# Patient Record
Sex: Male | Born: 1960
Health system: Southern US, Community
[De-identification: ages and names within clinical notes are randomized; demographics above are authoritative.]

## PROBLEM LIST (undated history)

## (undated) DIAGNOSIS — I10 Essential (primary) hypertension: Secondary | ICD-10-CM

## (undated) DIAGNOSIS — E119 Type 2 diabetes mellitus without complications: Secondary | ICD-10-CM

## (undated) DIAGNOSIS — M1990 Unspecified osteoarthritis, unspecified site: Secondary | ICD-10-CM

## (undated) DIAGNOSIS — E785 Hyperlipidemia, unspecified: Secondary | ICD-10-CM

## (undated) HISTORY — PX: CATARACT EXTRACTION: SUR2

## (undated) HISTORY — DX: Essential (primary) hypertension: I10

## (undated) HISTORY — DX: Hyperlipidemia, unspecified: E78.5

---

## 2005-10-21 ENCOUNTER — Ambulatory Visit: Payer: Self-pay | Admitting: Gastroenterology

## 2008-05-15 ENCOUNTER — Inpatient Hospital Stay: Payer: Self-pay | Admitting: Specialist

## 2013-03-01 ENCOUNTER — Emergency Department: Payer: Self-pay | Admitting: Emergency Medicine

## 2013-03-01 LAB — BASIC METABOLIC PANEL
Anion Gap: 8 (ref 7–16)
BUN: 13 mg/dL (ref 7–18)
Chloride: 106 mmol/L (ref 98–107)
Creatinine: 0.91 mg/dL (ref 0.60–1.30)
EGFR (African American): >60
EGFR (Non-African Amer.): >60
Glucose: 136 mg/dL — ABNORMAL HIGH (ref 65–99)
Potassium: 4.2 mmol/L (ref 3.5–5.1)
Sodium: 139 mmol/L (ref 136–145)

## 2013-03-01 LAB — CBC
HCT: 44.2 % (ref 40.0–52.0)
HGB: 15.3 g/dL (ref 13.0–18.0)
MCH: 32 pg (ref 26.0–34.0)
MCHC: 34.6 g/dL (ref 32.0–36.0)
RBC: 4.79 10*6/uL (ref 4.40–5.90)
RDW: 13.1 % (ref 11.5–14.5)

## 2013-03-01 LAB — TROPONIN I: Troponin-I: <0.02 ng/mL

## 2013-03-05 ENCOUNTER — Encounter: Payer: Self-pay | Admitting: Cardiovascular Disease

## 2013-03-05 ENCOUNTER — Ambulatory Visit (INDEPENDENT_AMBULATORY_CARE_PROVIDER_SITE_OTHER): Payer: BC Managed Care – PPO | Admitting: Cardiovascular Disease

## 2013-03-05 VITALS — BP 158/90 | HR 93 | Ht 67.0 in | Wt 180.5 lb

## 2013-03-05 VITALS — BP 142/90 | HR 91 | Ht 67.0 in | Wt 183.0 lb

## 2013-03-05 DIAGNOSIS — J40 Bronchitis, not specified as acute or chronic: Secondary | ICD-10-CM | POA: Insufficient documentation

## 2013-03-05 DIAGNOSIS — R079 Chest pain, unspecified: Secondary | ICD-10-CM

## 2013-03-05 DIAGNOSIS — E119 Type 2 diabetes mellitus without complications: Secondary | ICD-10-CM | POA: Insufficient documentation

## 2013-03-05 DIAGNOSIS — E785 Hyperlipidemia, unspecified: Secondary | ICD-10-CM | POA: Insufficient documentation

## 2013-03-05 DIAGNOSIS — Z87898 Personal history of other specified conditions: Secondary | ICD-10-CM | POA: Insufficient documentation

## 2013-03-05 DIAGNOSIS — E1165 Type 2 diabetes mellitus with hyperglycemia: Secondary | ICD-10-CM | POA: Insufficient documentation

## 2013-03-05 DIAGNOSIS — F17200 Nicotine dependence, unspecified, uncomplicated: Secondary | ICD-10-CM | POA: Insufficient documentation

## 2013-03-05 DIAGNOSIS — I10 Essential (primary) hypertension: Secondary | ICD-10-CM | POA: Insufficient documentation

## 2013-03-05 HISTORY — DX: Bronchitis, not specified as acute or chronic: J40

## 2013-03-05 NOTE — Procedures (Signed)
Exercise Treadmill Test Treadmill ordered for recent epsiodes of chest pain.  Resting EKG shows NSR with rate of 91 bpm, no significant ST or T wave changes Resting blood pressure of 142/90. Stand bruce protocal was used.  Patient exercised for 9 min 40 sec,  Peak heart rate of 153 bpm.  This was 91% of the maximum predicted heart rate (target heart rate 142). Achieved 10.1 METS No symptoms of chest pain or lightheadedness were reported at peak stress or in recovery.  Peak Blood pressure recorded was 220/82 Heart rate at 3 minutes in recovery was 99 bpm. No significant ST changes concerning for ischemia  FINAL IMPRESSION: Normal exercise stress test. No significant EKG changes concerning for ischemia. Excellent exercise tolerance.

## 2013-03-05 NOTE — Assessment & Plan Note (Signed)
History of angioedema on ACE inhibitors and 2010. no further episodes since that time.

## 2013-03-05 NOTE — Assessment & Plan Note (Signed)
Chest pain is very atypical in nature. More musculoskeletal sounding. We will order a routine treadmill study to rule out ischemia.

## 2013-03-05 NOTE — Patient Instructions (Signed)
You are doing well. No medication changes were made.  We will order a routine treadmill stress test for chest pain  Please call us if you have new issues that need to be addressed before your next appt.

## 2013-03-05 NOTE — Assessment & Plan Note (Signed)
We have encouraged continued exercise, careful diet management in an effort to lose weight. 

## 2013-03-05 NOTE — Assessment & Plan Note (Signed)
Blood pressure is well controlled on today's visit. No changes made to the medications. 

## 2013-03-05 NOTE — Assessment & Plan Note (Signed)
Encouraged him to stay on his statin. 

## 2013-03-05 NOTE — Patient Instructions (Signed)
Treadmill looks good. Follow up as needed.

## 2013-03-05 NOTE — Assessment & Plan Note (Signed)
Resolving bronchitis after recent treatment with Z-Pak

## 2013-03-05 NOTE — Assessment & Plan Note (Signed)
Long discussion about his smoking. He will try electronic cigarettes. We have encouraged him to continue to work on weaning his cigarettes and smoking cessation. He will continue to work on this and does not want any assistance with chantix.

## 2013-03-05 NOTE — Progress Notes (Signed)
Patient ID: Mark Hendrix, male    DOB: 1960/12/26, 52 y.o.   MRN: [id number]  HPI Comments: [name] is a 52 year old gentleman with a long smoking history from age [age], total of 52 years one pack per day, history of pericarditis  In 2010, episode at that time of ACE inhibitor angioedema who presents for new patient evaluation for chest pain.  He reports that he has had bronchitis in the past several weeks. He recently completed a Z-Pak. He does continue to work through this last 2 weeks. He does report having significant coughing spells. Several days ago, he was at work and was about to lift something and had his hands up when he had acute onset of left-sided chest pain. It lasted for a short period of time. He put his arms down, tried to rub it to make it go away. It seemed more prolonged than prior episodes of leaking sharp chest pain. EMTs were called and he was taken to [facility] on March 01 2013. He did have one further episode of sharp chest pain while walking on the way to the  ambulance . Symptoms resolved on the way. In the emergency room, he has normal-appearing EKG, normal cardiac enzymes.   Since the chest pain episode several days ago, he has had no further episodes. He feels well. He continues to cough though he feels bronchitis is slowly improving  Prior EKG January 2010 showing nonspecific ST elevations in 1, 2, 3, aVF  Echocardiogram January 2010 was essentially normal with normal ejection fraction  EKG today shows normal sinus rhythm with rate 93 beats per minute, no significant ST or T wave changes. EKG from 03/01/2013 showing normal sinus rhythm with rate 103 beats per minute with no significant ST or T wave changes   Outpatient Encounter Prescriptions as of 03/05/2013  Medication Sig Dispense Refill  . albuterol (PROAIR HFA) 108 (90 BASE) MCG/ACT inhaler Inhale 2 puffs into the lungs every 6 (six) hours as needed for wheezing.      . diltiazem (DILACOR XR) 180 MG  24 hr capsule Take 180 mg by mouth 2 (two) times daily.      Marland Kitchen ibuprofen (ADVIL,MOTRIN) 800 MG tablet Take 800 mg by mouth every 8 (eight) hours as needed for pain.      . metFORMIN (GLUMETZA) 500 MG (MOD) 24 hr tablet Take 1,000 mg by mouth 2 (two) times daily with a meal.      . omeprazole (PRILOSEC) 40 MG capsule Take 40 mg by mouth daily.      . simvastatin (ZOCOR) 20 MG tablet Take 20 mg by mouth daily.      . sitaGLIPtin (JANUVIA) 100 MG tablet Take 100 mg by mouth daily.       No facility-administered encounter medications on file as of 03/05/2013.     Review of Systems  Constitutional: Negative.   HENT: Negative.   Eyes: Negative.   Respiratory: Negative.   Cardiovascular: Negative.   Gastrointestinal: Negative.   Endocrine: Negative.   Musculoskeletal: Negative.   Skin: Negative.   Allergic/Immunologic: Negative.   Neurological: Negative.   Hematological: Negative.   Psychiatric/Behavioral: Negative.   All other systems reviewed and are negative.    BP 158/90  Pulse 93  Ht 5\' 7"  (1.702 m)  Wt 180 lb 8 oz (81.874 kg)  BMI 28.26 kg/m2  Physical Exam  Nursing note and vitals reviewed. Constitutional: He is oriented to person, place, and time. He appears well-developed and well-nourished.  HENT:  Head: Normocephalic.  Nose: Nose normal.  Mouth/Throat: Oropharynx is clear and moist.  Eyes: Conjunctivae are normal. Pupils are equal, round, and reactive to light.  Neck: Normal range of motion. Neck supple. No JVD present.  Cardiovascular: Normal rate, regular rhythm, S1 normal, S2 normal, normal heart sounds and intact distal pulses.  Exam reveals no gallop and no friction rub.   No murmur heard. Pulmonary/Chest: Effort normal and breath sounds normal. No respiratory distress. He has no wheezes. He has no rales. He exhibits no tenderness.  Abdominal: Soft. Bowel sounds are normal. He exhibits no distension. There is no tenderness.  Musculoskeletal: Normal range of  motion. He exhibits no edema and no tenderness.  Lymphadenopathy:    He has no cervical adenopathy.  Neurological: He is alert and oriented to person, place, and time. Coordination normal.  Skin: Skin is warm and dry. No rash noted. No erythema.  Psychiatric: He has a normal mood and affect. His behavior is normal. Judgment and thought content normal.      Assessment and Plan

## 2013-03-08 ENCOUNTER — Encounter: Payer: Self-pay | Admitting: *Deleted

## 2016-07-11 ENCOUNTER — Encounter: Payer: Self-pay | Admitting: *Deleted

## 2016-07-12 ENCOUNTER — Ambulatory Visit: Payer: BLUE CROSS/BLUE SHIELD | Admitting: Anesthesiology

## 2016-07-12 ENCOUNTER — Encounter: Payer: Self-pay | Admitting: *Deleted

## 2016-07-12 ENCOUNTER — Ambulatory Visit
Admission: RE | Admit: 2016-07-12 | Discharge: 2016-07-12 | Disposition: A | Discharge status: Home / Self Care | Payer: BLUE CROSS/BLUE SHIELD | Source: Ambulatory Visit | Attending: Gastroenterology | Admitting: Gastroenterology

## 2016-07-12 ENCOUNTER — Encounter
Admission: RE | Disposition: A | Discharge status: Home / Self Care | Payer: Self-pay | Source: Ambulatory Visit | Attending: Gastroenterology

## 2016-07-12 DIAGNOSIS — Z79899 Other long term (current) drug therapy: Secondary | ICD-10-CM | POA: Insufficient documentation

## 2016-07-12 DIAGNOSIS — Z7984 Long term (current) use of oral hypoglycemic drugs: Secondary | ICD-10-CM | POA: Insufficient documentation

## 2016-07-12 DIAGNOSIS — K640 First degree hemorrhoids: Secondary | ICD-10-CM | POA: Insufficient documentation

## 2016-07-12 DIAGNOSIS — M1990 Unspecified osteoarthritis, unspecified site: Secondary | ICD-10-CM | POA: Diagnosis not present

## 2016-07-12 DIAGNOSIS — D122 Benign neoplasm of ascending colon: Secondary | ICD-10-CM | POA: Insufficient documentation

## 2016-07-12 DIAGNOSIS — Z1211 Encounter for screening for malignant neoplasm of colon: Secondary | ICD-10-CM | POA: Diagnosis present

## 2016-07-12 DIAGNOSIS — D123 Benign neoplasm of transverse colon: Secondary | ICD-10-CM | POA: Insufficient documentation

## 2016-07-12 DIAGNOSIS — Z86010 Personal history of colonic polyps: Secondary | ICD-10-CM | POA: Diagnosis not present

## 2016-07-12 DIAGNOSIS — I10 Essential (primary) hypertension: Secondary | ICD-10-CM | POA: Insufficient documentation

## 2016-07-12 DIAGNOSIS — F17200 Nicotine dependence, unspecified, uncomplicated: Secondary | ICD-10-CM | POA: Diagnosis not present

## 2016-07-12 DIAGNOSIS — J449 Chronic obstructive pulmonary disease, unspecified: Secondary | ICD-10-CM | POA: Insufficient documentation

## 2016-07-12 DIAGNOSIS — E119 Type 2 diabetes mellitus without complications: Secondary | ICD-10-CM | POA: Insufficient documentation

## 2016-07-12 DIAGNOSIS — E785 Hyperlipidemia, unspecified: Secondary | ICD-10-CM | POA: Diagnosis not present

## 2016-07-12 DIAGNOSIS — K621 Rectal polyp: Secondary | ICD-10-CM | POA: Diagnosis not present

## 2016-07-12 HISTORY — DX: Unspecified osteoarthritis, unspecified site: M19.90

## 2016-07-12 HISTORY — DX: Type 2 diabetes mellitus without complications: E11.9

## 2016-07-12 HISTORY — PX: COLONOSCOPY WITH PROPOFOL: SHX5780

## 2016-07-12 LAB — GLUCOSE, CAPILLARY: GLUCOSE-CAPILLARY: 165 mg/dL — AB (ref 65–99)

## 2016-07-12 SURGERY — COLONOSCOPY WITH PROPOFOL
Anesthesia: General

## 2016-07-12 MED ORDER — FENTANYL CITRATE (PF) 100 MCG/2ML IJ SOLN
INTRAMUSCULAR | Status: DC | PRN
  Administered 2016-07-12: 100 ug via INTRAVENOUS

## 2016-07-12 MED ORDER — PROPOFOL 10 MG/ML IV BOLUS
INTRAVENOUS | Status: DC | PRN
  Administered 2016-07-12 (×2): 40 mg via INTRAVENOUS

## 2016-07-12 MED ORDER — MIDAZOLAM HCL 2 MG/2ML IJ SOLN
INTRAMUSCULAR | Status: DC | PRN
  Administered 2016-07-12: 2 mg via INTRAVENOUS

## 2016-07-12 MED ORDER — PROPOFOL 500 MG/50ML IV EMUL
INTRAVENOUS | Status: AC
  Filled 2016-07-12: qty 50

## 2016-07-12 MED ORDER — ESMOLOL HCL 100 MG/10ML IV SOLN
INTRAVENOUS | Status: AC
  Filled 2016-07-12: qty 10

## 2016-07-12 MED ORDER — LIDOCAINE HCL (CARDIAC) 20 MG/ML IV SOLN
INTRAVENOUS | Status: DC | PRN
  Administered 2016-07-12: 2 mL via INTRAVENOUS

## 2016-07-12 MED ORDER — SODIUM CHLORIDE 0.9 % IV SOLN: INTRAVENOUS | Status: DC

## 2016-07-12 MED ORDER — SODIUM CHLORIDE 0.9 % IV SOLN
INTRAVENOUS | Status: DC
  Administered 2016-07-12: 1000 mL via INTRAVENOUS

## 2016-07-12 MED ORDER — PROPOFOL 500 MG/50ML IV EMUL
INTRAVENOUS | Status: DC | PRN
  Administered 2016-07-12: 120 ug/kg/min via INTRAVENOUS

## 2016-07-12 MED ORDER — LIDOCAINE HCL (PF) 2 % IJ SOLN
INTRAMUSCULAR | Status: AC
Start: 2016-07-12 — End: 2016-07-12
  Filled 2016-07-12: qty 2

## 2016-07-12 MED ORDER — FENTANYL CITRATE (PF) 100 MCG/2ML IJ SOLN
INTRAMUSCULAR | Status: AC
  Filled 2016-07-12: qty 2

## 2016-07-12 MED ORDER — ESMOLOL HCL 100 MG/10ML IV SOLN
INTRAVENOUS | Status: DC | PRN
  Administered 2016-07-12: 30 mg via INTRAVENOUS

## 2016-07-12 MED ORDER — MIDAZOLAM HCL 2 MG/2ML IJ SOLN
INTRAMUSCULAR | Status: AC
  Filled 2016-07-12: qty 2

## 2016-07-12 NOTE — H&P (Signed)
Outpatient short stay form Pre-procedure 07/12/2016 7:24 AM Lollie Sails MD  Primary Physician: Sallyanne Havers NP  Reason for visit:  Colonoscopy  History of present illness:  Patient is a 56 year old male presenting as above. His last colonoscopy was 8 or 9 years ago. He does have a history of colon polyps. He tolerated his prep well. He takes no blood thinning agents or aspirin's. Had an episode of some rectal bleeding that lasted for several months but has currently resolved. This had no rectal pain or abdominal pain.    Current Facility-Administered Medications:  .  0.9 %  sodium chloride infusion, , Intravenous, Continuous, Lollie Sails, MD .  0.9 %  sodium chloride infusion, , Intravenous, Continuous, Lollie Sails, MD  Prescriptions Prior to Admission  Medication Sig Dispense Refill Last Dose  . diltiazem (DILACOR XR) 180 MG 24 hr capsule Take 180 mg by mouth 2 (two) times daily.   07/12/2016 at 0600  . ibuprofen (ADVIL,MOTRIN) 800 MG tablet Take 800 mg by mouth every 8 (eight) hours as needed for pain.   Past Week at Unknown time  . metFORMIN (GLUMETZA) 500 MG (MOD) 24 hr tablet Take 1,000 mg by mouth 2 (two) times daily with a meal.   07/11/2016 at Unknown time  . omeprazole (PRILOSEC) 40 MG capsule Take 40 mg by mouth daily.   07/12/2016 at 0600  . simvastatin (ZOCOR) 20 MG tablet Take 20 mg by mouth daily.   07/11/2016 at Unknown time  . sitaGLIPtin (JANUVIA) 100 MG tablet Take 100 mg by mouth daily.   07/11/2016 at Unknown time  . albuterol (PROAIR HFA) 108 (90 BASE) MCG/ACT inhaler Inhale 2 puffs into the lungs every 6 (six) hours as needed for wheezing.   Not Taking at Unknown time     Allergies  Allergen Reactions  . Ace Inhibitors     SWELLING     Past Medical History:  Diagnosis Date  . Arthritis   . Diabetes mellitus without complication ([place])   . Hyperlipidemia   . Hypertension     Review of systems:      Physical Exam    Heart and lungs: Regular  rate and rhythm without rub or gallop, lungs are bilaterally clear with occasional wheezing    HEENT: Normocephalic atraumatic eyes are anicteric    Other:     Pertinant exam for procedure: Soft nontender nondistended bowel sounds positive normoactive.    Planned proceedures: Colonoscopy and indicated procedures. I have discussed the risks benefits and complications of procedures to include not limited to bleeding, infection, perforation and the risk of sedation and the patient wishes to proceed.    Lollie Sails, MD Gastroenterology 07/12/2016  7:24 AM

## 2016-07-12 NOTE — Anesthesia Preprocedure Evaluation (Signed)
Anesthesia Evaluation  Patient identified by MRN, date of birth, ID band Patient awake    Reviewed: Allergy & Precautions, NPO status , Patient's Chart, lab work & pertinent test results  Airway Mallampati: II       Dental  (+) Teeth Intact   Pulmonary COPD, Current Smoker,     + decreased breath sounds      Cardiovascular Exercise Tolerance: Good hypertension, Pt. on medications  Rhythm:Regular     Neuro/Psych negative neurological ROS     GI/Hepatic negative GI ROS, Neg liver ROS,   Endo/Other  diabetes, Type 2, Oral Hypoglycemic Agents  Renal/GU negative Renal ROS     Musculoskeletal   Abdominal (+) + obese,   Peds  Hematology   Anesthesia Other Findings   Reproductive/Obstetrics                             Anesthesia Physical Anesthesia Plan  ASA: III  Anesthesia Plan: General   Post-op Pain Management:    Induction: Intravenous  Airway Management Planned: Natural Airway and Nasal Cannula  Additional Equipment:   Intra-op Plan:   Post-operative Plan:   Informed Consent: I have reviewed the patients History and Physical, chart, labs and discussed the procedure including the risks, benefits and alternatives for the proposed anesthesia with the patient or authorized representative who has indicated his/her understanding and acceptance.     Plan Discussed with: CRNA  Anesthesia Plan Comments:         Anesthesia Quick Evaluation

## 2016-07-12 NOTE — Anesthesia Post-op Follow-up Note (Signed)
Anesthesia QCDR form completed.        

## 2016-07-12 NOTE — Op Note (Signed)
[facility] Gastroenterology Patient Name: Mark Hendrix Procedure Date: 07/12/2016 7:20 AM MRN: [id number] Account #: [id number] Date of Birth: [birth year] Admit Type: Outpatient Age: 56 Room: [facility] ENDO ROOM 3 Gender: Male Note Status: Finalized Procedure:            Colonoscopy Indications:          Rectal bleeding, Personal history of colonic polyps Providers:            Lollie Sails, MD Medicines:            Monitored Anesthesia Care Complications:        No immediate complications. Procedure:            Pre-Anesthesia Assessment:                       - ASA Grade Assessment: III - A patient with severe                        systemic disease.                       After obtaining informed consent, the colonoscope was                        passed under direct vision. Throughout the procedure,                        the patient's blood pressure, pulse, and oxygen                        saturations were monitored continuously. The                        Colonoscope was introduced through the anus and                        advanced to the the cecum, identified by appendiceal                        orifice and ileocecal valve. The colonoscopy was                        performed with moderate difficulty due to a tortuous                        colon. Successful completion of the procedure was aided                        by changing the patient to a supine position. Findings:      Two sessile polyps were found in the ascending colon. The polyps were 2       to 4 mm in size. These polyps were removed with a cold snare and cold       forcep. Resection and retrieval were complete.      Two sessile polyps were found in the splenic flexure. The polyps were 1       to 2 mm in size. These polyps were removed with a cold biopsy forceps.       Resection and retrieval were complete.      A 2 mm polyp was found in the sigmoid colon. The polyp  was sessile. The   polyp was removed with a cold biopsy forceps. Resection and retrieval       were complete.      A 4 mm polyp was found in the rectum. The polyp was sessile. The polyp       was removed with a cold snare. Resection and retrieval were complete.      Non-bleeding internal hemorrhoids were found during retroflexion and       during anoscopy. The hemorrhoids were medium-sized and Grade I (internal       hemorrhoids that do not prolapse).      The digital rectal exam was normal. - a small piece of cellophane type       plasic? was noted in the ascending colon, could not be drawn into the       scope for removal, will pass with stool. Impression:           - Two 2 to 4 mm polyps in the ascending colon, removed                        with a cold snare. Resected and retrieved.                       - Two 1 to 2 mm polyps at the splenic flexure, removed                        with a cold biopsy forceps. Resected and retrieved.                       - One 2 mm polyp in the sigmoid colon, removed with a                        cold biopsy forceps. Resected and retrieved.                       - One 4 mm polyp in the rectum, removed with a cold                        snare. Resected and retrieved.                       - Non-bleeding internal hemorrhoids. Recommendation:       - Discharge patient to home.                       - Advance diet as tolerated.                       - Telephone GI clinic for pathology results in 1 week.                       -if there are recurrent symptoms of rectal bleeding,                        treat for hemorrhoids. Lollie Sails, MD 07/12/2016 8:31:29 AM This report has been signed electronically. Number of Addenda: 0 Note Initiated On: 07/12/2016 7:20 AM Scope Withdrawal Time: 0 hours 15 minutes 6 seconds  Total Procedure Duration: 0 hours 36 minutes 44 seconds       [facility]

## 2016-07-12 NOTE — Transfer of Care (Signed)
Immediate Anesthesia Transfer of Care Note  Patient: Mark Hendrix  Procedure(s) Performed: Procedure(s): COLONOSCOPY WITH PROPOFOL (N/A)  Patient Location: PACU  Anesthesia Type:General  Level of Consciousness: awake  Airway & Oxygen Therapy: Patient Spontanous Breathing and Patient connected to nasal cannula oxygen  Post-op Assessment: Report given to RN and Post -op Vital signs reviewed and stable  Post vital signs: Reviewed  Last Vitals:  Vitals:   07/12/16 0712  BP: (!) 184/99  Pulse: (!) 103  Resp: 20  Temp: 37.2 C    Last Pain:  Vitals:   07/12/16 0712  TempSrc: Tympanic         Complications: No apparent anesthesia complications

## 2016-07-15 ENCOUNTER — Encounter: Payer: Self-pay | Admitting: Gastroenterology

## 2016-07-15 LAB — SURGICAL PATHOLOGY

## 2016-07-15 NOTE — Anesthesia Postprocedure Evaluation (Signed)
Anesthesia Post Note  Patient: Mark Hendrix  Procedure(s) Performed: Procedure(s) (LRB): COLONOSCOPY WITH PROPOFOL (N/A)  Patient location during evaluation: PACU Anesthesia Type: General Level of consciousness: awake Pain management: pain level controlled Vital Signs Assessment: post-procedure vital signs reviewed and stable Respiratory status: spontaneous breathing Cardiovascular status: stable Anesthetic complications: no     Last Vitals:  Vitals:   07/12/16 0847 07/12/16 0857  BP: (!) 178/97 (!) 168/85  Pulse: 93 90  Resp: (!) 21 20  Temp:      Last Pain:  Vitals:   07/12/16 0827  TempSrc: Tympanic                 VAN STAVEREN,Ralyn Stlaurent

## 2017-03-19 ENCOUNTER — Ambulatory Visit: Payer: 59 | Admitting: Family

## 2018-11-23 DIAGNOSIS — I10 Essential (primary) hypertension: Secondary | ICD-10-CM | POA: Diagnosis not present

## 2018-11-23 DIAGNOSIS — R5381 Other malaise: Secondary | ICD-10-CM | POA: Diagnosis not present

## 2018-11-23 DIAGNOSIS — Z125 Encounter for screening for malignant neoplasm of prostate: Secondary | ICD-10-CM | POA: Diagnosis not present

## 2018-11-23 DIAGNOSIS — E7849 Other hyperlipidemia: Secondary | ICD-10-CM | POA: Diagnosis not present

## 2018-12-07 DIAGNOSIS — I10 Essential (primary) hypertension: Secondary | ICD-10-CM | POA: Diagnosis not present

## 2018-12-07 DIAGNOSIS — Z0000 Encounter for general adult medical examination without abnormal findings: Secondary | ICD-10-CM | POA: Diagnosis not present

## 2018-12-07 DIAGNOSIS — E889 Metabolic disorder, unspecified: Secondary | ICD-10-CM | POA: Diagnosis not present

## 2018-12-07 DIAGNOSIS — E1169 Type 2 diabetes mellitus with other specified complication: Secondary | ICD-10-CM | POA: Diagnosis not present

## 2018-12-07 DIAGNOSIS — E785 Hyperlipidemia, unspecified: Secondary | ICD-10-CM | POA: Diagnosis not present

## 2019-01-15 DIAGNOSIS — Z0001 Encounter for general adult medical examination with abnormal findings: Secondary | ICD-10-CM | POA: Diagnosis not present

## 2019-01-15 DIAGNOSIS — Z794 Long term (current) use of insulin: Secondary | ICD-10-CM | POA: Diagnosis not present

## 2019-01-15 DIAGNOSIS — E7841 Elevated Lipoprotein(a): Secondary | ICD-10-CM | POA: Diagnosis not present

## 2019-01-15 DIAGNOSIS — E119 Type 2 diabetes mellitus without complications: Secondary | ICD-10-CM | POA: Diagnosis not present

## 2019-01-15 DIAGNOSIS — I119 Hypertensive heart disease without heart failure: Secondary | ICD-10-CM | POA: Diagnosis not present

## 2019-01-15 DIAGNOSIS — I10 Essential (primary) hypertension: Secondary | ICD-10-CM | POA: Diagnosis not present

## 2019-01-15 DIAGNOSIS — Z720 Tobacco use: Secondary | ICD-10-CM | POA: Diagnosis not present

## 2019-02-24 DIAGNOSIS — H353221 Exudative age-related macular degeneration, left eye, with active choroidal neovascularization: Secondary | ICD-10-CM | POA: Diagnosis not present

## 2019-04-02 DIAGNOSIS — H353221 Exudative age-related macular degeneration, left eye, with active choroidal neovascularization: Secondary | ICD-10-CM | POA: Diagnosis not present

## 2019-05-05 DIAGNOSIS — H353221 Exudative age-related macular degeneration, left eye, with active choroidal neovascularization: Secondary | ICD-10-CM | POA: Diagnosis not present

## 2019-06-16 DIAGNOSIS — H353221 Exudative age-related macular degeneration, left eye, with active choroidal neovascularization: Secondary | ICD-10-CM | POA: Diagnosis not present

## 2019-07-21 DIAGNOSIS — H353221 Exudative age-related macular degeneration, left eye, with active choroidal neovascularization: Secondary | ICD-10-CM | POA: Diagnosis not present

## 2019-08-19 ENCOUNTER — Ambulatory Visit: Payer: Self-pay | Attending: Internal Medicine

## 2019-09-08 DIAGNOSIS — H353221 Exudative age-related macular degeneration, left eye, with active choroidal neovascularization: Secondary | ICD-10-CM | POA: Diagnosis not present

## 2019-09-17 ENCOUNTER — Other Ambulatory Visit: Payer: Self-pay | Admitting: Internal Medicine

## 2019-09-27 ENCOUNTER — Other Ambulatory Visit: Payer: Self-pay | Admitting: Internal Medicine

## 2019-10-15 ENCOUNTER — Other Ambulatory Visit: Payer: Self-pay | Admitting: Internal Medicine

## 2019-10-20 DIAGNOSIS — H353221 Exudative age-related macular degeneration, left eye, with active choroidal neovascularization: Secondary | ICD-10-CM | POA: Diagnosis not present

## 2019-11-02 ENCOUNTER — Ambulatory Visit (INDEPENDENT_AMBULATORY_CARE_PROVIDER_SITE_OTHER): Payer: BC Managed Care – PPO | Admitting: Internal Medicine

## 2019-11-02 ENCOUNTER — Encounter: Payer: Self-pay | Admitting: Internal Medicine

## 2019-11-02 ENCOUNTER — Other Ambulatory Visit: Payer: Self-pay

## 2019-11-02 VITALS — BP 168/89 | HR 89 | Ht 69.0 in | Wt 194.6 lb

## 2019-11-02 DIAGNOSIS — F17200 Nicotine dependence, unspecified, uncomplicated: Secondary | ICD-10-CM

## 2019-11-02 DIAGNOSIS — E782 Mixed hyperlipidemia: Secondary | ICD-10-CM

## 2019-11-02 DIAGNOSIS — I10 Essential (primary) hypertension: Secondary | ICD-10-CM | POA: Diagnosis not present

## 2019-11-02 DIAGNOSIS — E1169 Type 2 diabetes mellitus with other specified complication: Secondary | ICD-10-CM | POA: Diagnosis not present

## 2019-11-02 LAB — GLUCOSE, POCT (MANUAL RESULT ENTRY): POC Glucose: 181 mg/dl — AB (ref 70–99)

## 2019-11-02 MED ORDER — DOXAZOSIN MESYLATE 4 MG PO TABS: 4.0000 mg | ORAL_TABLET | Freq: Every day | ORAL | 3 refills | Status: DC

## 2019-11-02 NOTE — Assessment & Plan Note (Signed)
Is smoking 1 pack/day was advised to cut down slowly

## 2019-11-02 NOTE — Assessment & Plan Note (Signed)
-   The patient's blood sugar is under control on metformin.and actose - The patient will continue the current treatment regimen.  - I encouraged the patient to regularly check blood sugar.  - I encouraged the patient to monitor diet. I encouraged the patient to eat low-carb and low-sugar to help prevent blood sugar spikes.  - I encouraged the patient to continue following their prescribed treatment plan for diabetes - I informed the patient to get help if blood sugar drops below 54mg /dL, or if suddenly have trouble thinking clearly or breathing.

## 2019-11-02 NOTE — Assessment & Plan Note (Signed)
Continue statin. 

## 2019-11-02 NOTE — Assessment & Plan Note (Signed)
-   Today, the patient's blood pressure is not well managed on diltiazem. - The patient will continue the current treatment regimen.  Will add cardura 4mg  po daily - I encouraged the patient to eat a low-sodium diet to help control blood pressure. - I encouraged the patient to live an active lifestyle and complete activities that increases heart rate to 85% target heart rate at least 5 times per week for one hour.

## 2019-11-02 NOTE — Progress Notes (Signed)
Established Patient Office Visit  SUBJECTIVE:  Subjective  Patient ID: Mark Hendrix, male    DOB: Jul 06, 1960  Age: 59 y.o. MRN: [id number]  CC:  Chief Complaint  Patient presents with  . Diabetes    Pt reports elevated blood sugar readings  . Hypertension    Pt reports elevated blood pressure readings    HPI Mark Hendrix is a 59 y.o. male presenting today for follow-up of his hypertension and diabetes.  Yesterday his BP at work was 160/100 and he was dizzy, while today in the morning his BP was 175/96, and a bit later it was 165/94. In the office today his BP reading was 190/105 and repeat was 168/89; he denies having CP. His blood sugar this morning before breakfast was 170; his baseline is 150s-170s.  He stopped taking ibuprofen. He is taking diltiazem and he denies having any leg swelling. He is also taking hydrochlorothiazide 25 mg. He is taking metformin, pioglitazone and insulin 35 units. He is still smoking 1 PPD. He is seeing the ophthalmologist at [facility] and receiving injections there. He is reporting blurry vision with and without his prescription glasses.  Past Medical History:  Diagnosis Date  . Arthritis   . Diabetes mellitus without complication ([place])   . Hyperlipidemia   . Hypertension     Past Surgical History:  Procedure Laterality Date  . COLONOSCOPY WITH PROPOFOL N/A 07/12/2016   Procedure: COLONOSCOPY WITH PROPOFOL;  Surgeon: Lollie Sails, MD;  Location: [facility] ENDOSCOPY;  Service: Endoscopy;  Laterality: N/A;    Family History  Problem Relation Age of Onset  . Diabetes Mother     Social History   Socioeconomic History  . Marital status: Married    Spouse name: Not on file  . Number of children: Not on file  . Years of education: Not on file  . Highest education level: Not on file  Occupational History  . Not on file  Tobacco Use  . Smoking status: Current Every Day Smoker    Packs/day: 1.00    Years:  40.00    Pack years: 40.00    Types: Cigarettes  . Smokeless tobacco: Never Used  Substance and Sexual Activity  . Alcohol use: Yes    Alcohol/week: 6.0 standard drinks    Types: 6 Cans of beer per week    Comment: daily  . Drug use: No  . Sexual activity: Not on file  Other Topics Concern  . Not on file  Social History Narrative  . Not on file   Social Determinants of Health   Financial Resource Strain:   . Difficulty of Paying Living Expenses:   Food Insecurity:   . Worried About Charity fundraiser in the Last Year:   . Arboriculturist in the Last Year:   Transportation Needs:   . Film/video editor (Medical):   Marland Kitchen Lack of Transportation (Non-Medical):   Physical Activity:   . Days of Exercise per Week:   . Minutes of Exercise per Session:   Stress:   . Feeling of Stress :   Social Connections:   . Frequency of Communication with Friends and Family:   . Frequency of Social Gatherings with Friends and Family:   . Attends Religious Services:   . Active Member of Clubs or Organizations:   . Attends Archivist Meetings:   Marland Kitchen Marital Status:   Intimate Partner Violence:   . Fear of Current or  Ex-Partner:   . Emotionally Abused:   Marland Kitchen Physically Abused:   . Sexually Abused:      Current Outpatient Medications:  .  diltiazem (DILACOR XR) 180 MG 24 hr capsule, Take 180 mg by mouth 2 (two) times daily., Disp: , Rfl:  .  hydrochlorothiazide (HYDRODIURIL) 25 MG tablet, TAKE ONE TABLET EVERY DAY, Disp: 30 tablet, Rfl: 6 .  ibuprofen (ADVIL,MOTRIN) 800 MG tablet, Take 800 mg by mouth every 8 (eight) hours as needed for pain., Disp: , Rfl:  .  metFORMIN (GLUMETZA) 500 MG (MOD) 24 hr tablet, Take 1,000 mg by mouth 2 (two) times daily with a meal., Disp: , Rfl:  .  omeprazole (PRILOSEC) 40 MG capsule, Take 40 mg by mouth daily., Disp: , Rfl:  .  pioglitazone (ACTOS) 30 MG tablet, TAKE ONE TABLET BY MOUTH EVERY DAY, Disp: 30 tablet, Rfl: 6 .  simvastatin (ZOCOR) 20 MG  tablet, TAKE ONE TABLET EVERY DAY, Disp: 30 tablet, Rfl: 6 .  doxazosin (CARDURA) 4 MG tablet, Take 1 tablet (4 mg total) by mouth daily., Disp: 30 tablet, Rfl: 3   Allergies  Allergen Reactions  . Ace Inhibitors Swelling    ROS Review of Systems  Constitutional: Negative.   HENT: Negative.   Eyes: Positive for visual disturbance (blurry vision).  Respiratory: Negative.   Cardiovascular: Negative.  Negative for chest pain and leg swelling.  Gastrointestinal: Negative.   Endocrine: Negative.   Genitourinary: Negative.   Musculoskeletal: Negative.   Skin: Negative.   Allergic/Immunologic: Negative.   Neurological: Positive for dizziness.  Hematological: Negative.   Psychiatric/Behavioral: Negative.   All other systems reviewed and are negative.    OBJECTIVE:    Physical Exam Vitals reviewed.  Constitutional:      Appearance: Normal appearance.  Neck:     Vascular: No carotid bruit.  Cardiovascular:     Rate and Rhythm: Normal rate and regular rhythm.     Pulses: Normal pulses.     Heart sounds: Normal heart sounds.  Pulmonary:     Effort: Pulmonary effort is normal.     Breath sounds: Normal breath sounds.  Abdominal:     General: Bowel sounds are normal.     Palpations: Abdomen is soft. There is no hepatomegaly or splenomegaly.     Tenderness: There is no abdominal tenderness.     Hernia: No hernia is present.  Musculoskeletal:     Right lower leg: No edema.     Left lower leg: No edema.  Skin:    Findings: No rash.  Neurological:     Mental Status: He is alert and oriented to person, place, and time.  Psychiatric:        Mood and Affect: Mood normal.        Behavior: Behavior normal.     BP (!) 168/89   Pulse 89   Ht 5\' 9"  (1.753 m)   Wt 194 lb 9.6 oz (88.3 kg)   BMI 28.74 kg/m  Wt Readings from Last 3 Encounters:  11/02/19 194 lb 9.6 oz (88.3 kg)  07/12/16 187 lb (84.8 kg)  03/05/13 183 lb (83 kg)    Health Maintenance Due  Topic Date Due  .  HEMOGLOBIN A1C  Never done  . Hepatitis C Screening  Never done  . PNEUMOCOCCAL POLYSACCHARIDE VACCINE AGE 62-64 HIGH RISK  Never done  . FOOT EXAM  Never done  . OPHTHALMOLOGY EXAM  Never done  . COVID-19 Vaccine (1) Never done  . HIV  Screening  Never done  . TETANUS/TDAP  Never done    There are no preventive care reminders to display for this patient.  CBC Latest Ref Rng & Units 03/01/2013  WBC 3.8 - 10.6 x10 3/mm 3 12.6(H)  Hemoglobin 13.0 - 18.0 g/dL 15.3  Hematocrit 40.0 - 52.0 % 44.2  Platelets 150 - 440 x10 3/mm 3 323   CMP Latest Ref Rng & Units 03/01/2013  Glucose 65 - 99 mg/dL 136(H)  BUN 7 - 18 mg/dL 13  Creatinine 0.60 - 1.30 mg/dL 0.91  Sodium 136 - 145 mmol/L 139  Potassium 3.5 - 5.1 mmol/L 4.2  Chloride 98 - 107 mmol/L 106  CO2 21 - 32 mmol/L 25  Calcium 8.5 - 10.1 mg/dL 9.0    No results found for: TSH Lab Results  Component Value Date   ANIONGAP 8 03/01/2013   No results found for: CHOL, HDL, LDLCALC, CHOLHDL No results found for: TRIG No results found for: HGBA1C    ASSESSMENT & PLAN:   Problem List Items Addressed This Visit      Cardiovascular and Mediastinum   Essential hypertension - Primary    - Today, the patient's blood pressure is not well managed on diltiazem. - The patient will continue the current treatment regimen.  Will add cardura 4mg  po daily - I encouraged the patient to eat a low-sodium diet to help control blood pressure. - I encouraged the patient to live an active lifestyle and complete activities that increases heart rate to 85% target heart rate at least 5 times per week for one hour.         Relevant Medications   doxazosin (CARDURA) 4 MG tablet     Endocrine   Type 2 diabetes mellitus ([place])    - The patient's blood sugar is under control on metformin.and actose - The patient will continue the current treatment regimen.  - I encouraged the patient to regularly check blood sugar.  - I encouraged the patient to  monitor diet. I encouraged the patient to eat low-carb and low-sugar to help prevent blood sugar spikes.  - I encouraged the patient to continue following their prescribed treatment plan for diabetes - I informed the patient to get help if blood sugar drops below 54mg /dL, or if suddenly have trouble thinking clearly or breathing.          Relevant Orders   POCT glucose (manual entry) (Completed)     Other   Smoker    Is smoking 1 pack/day was advised to cut down slowly      Hyperlipidemia    Continue statin      Relevant Medications   doxazosin (CARDURA) 4 MG tablet      Meds ordered this encounter  Medications  . doxazosin (CARDURA) 4 MG tablet    Sig: Take 1 tablet (4 mg total) by mouth daily.    Dispense:  30 tablet    Refill:  3    1. Type 2 diabetes mellitus with other specified complication, unspecified whether long term insulin use (HCC)  - POCT glucose (manual entry)  2. Essential hypertension  - doxazosin (CARDURA) 4 MG tablet; Take 1 tablet (4 mg total) by mouth daily.  Dispense: 30 tablet; Refill: 3  3. Mixed hyperlipidemia   4. Smoker    Follow-up: Return in about 1 week (around 11/09/2019).    Dr. Jane Canary [facility] [address], [place], [place] [address]   By signing my name  below, I, Milinda Antis, attest that this documentation has been prepared under the direction and in the presence of Cletis Athens, MD. Electronically Signed: Cletis Athens, MD 11/02/19, 10:38 AM   I personally performed the services described in this documentation, which was SCRIBED in my presence. The recorded information has been reviewed and considered accurate. It has been edited as necessary during review. Cletis Athens, MD

## 2019-11-05 ENCOUNTER — Ambulatory Visit (INDEPENDENT_AMBULATORY_CARE_PROVIDER_SITE_OTHER): Payer: BC Managed Care – PPO | Admitting: Internal Medicine

## 2019-11-05 ENCOUNTER — Other Ambulatory Visit: Payer: Self-pay

## 2019-11-05 ENCOUNTER — Other Ambulatory Visit: Payer: Self-pay | Admitting: Internal Medicine

## 2019-11-05 ENCOUNTER — Encounter: Payer: Self-pay | Admitting: Internal Medicine

## 2019-11-05 VITALS — BP 152/89 | HR 103 | Ht 67.0 in | Wt 195.5 lb

## 2019-11-05 DIAGNOSIS — J40 Bronchitis, not specified as acute or chronic: Secondary | ICD-10-CM | POA: Diagnosis not present

## 2019-11-05 DIAGNOSIS — I10 Essential (primary) hypertension: Secondary | ICD-10-CM | POA: Diagnosis not present

## 2019-11-05 DIAGNOSIS — E782 Mixed hyperlipidemia: Secondary | ICD-10-CM

## 2019-11-05 DIAGNOSIS — E1169 Type 2 diabetes mellitus with other specified complication: Secondary | ICD-10-CM

## 2019-11-05 LAB — GLUCOSE, POCT (MANUAL RESULT ENTRY): POC Glucose: 223 mg/dl — AB (ref 70–99)

## 2019-11-05 NOTE — Progress Notes (Signed)
Established Patient Office Visit  SUBJECTIVE:  Subjective  Patient ID: Mark Hendrix, male    DOB: 04/18/1961  Age: 59 y.o. MRN: [id number]  CC:  Chief Complaint  Patient presents with  . Hypertension    Pt is here for a blood pressure recheck. Patient was started on a new blood pressure medication Tuesday    HPI Mark Hendrix is a 59 y.o. male presenting today for a blood pressure recheck on his new medication.  He has been taking his medications as directed.   He got his COVID-19 vaccine. He received the first Moderna shot on 08/19/2019 ([id number]) and the second dose on 09/14/2019 (IOX#[id number]).   Past Medical History:  Diagnosis Date  . Arthritis   . Diabetes mellitus without complication ([place])   . Hyperlipidemia   . Hypertension     Past Surgical History:  Procedure Laterality Date  . COLONOSCOPY WITH PROPOFOL N/A 07/12/2016   Procedure: COLONOSCOPY WITH PROPOFOL;  Surgeon: Lollie Sails, MD;  Location: [facility] ENDOSCOPY;  Service: Endoscopy;  Laterality: N/A;    Family History  Problem Relation Age of Onset  . Diabetes Mother     Social History   Socioeconomic History  . Marital status: Married    Spouse name: Not on file  . Number of children: Not on file  . Years of education: Not on file  . Highest education level: Not on file  Occupational History  . Not on file  Tobacco Use  . Smoking status: Current Every Day Smoker    Packs/day: 1.00    Years: 40.00    Pack years: 40.00    Types: Cigarettes  . Smokeless tobacco: Never Used  Substance and Sexual Activity  . Alcohol use: Yes    Alcohol/week: 6.0 standard drinks    Types: 6 Cans of beer per week    Comment: daily  . Drug use: No  . Sexual activity: Not on file  Other Topics Concern  . Not on file  Social History Narrative  . Not on file   Social Determinants of Health   Financial Resource Strain:   . Difficulty of Paying Living Expenses:   Food Insecurity:   . Worried About  Charity fundraiser in the Last Year:   . Arboriculturist in the Last Year:   Transportation Needs:   . Film/video editor (Medical):   Marland Kitchen Lack of Transportation (Non-Medical):   Physical Activity:   . Days of Exercise per Week:   . Minutes of Exercise per Session:   Stress:   . Feeling of Stress :   Social Connections:   . Frequency of Communication with Friends and Family:   . Frequency of Social Gatherings with Friends and Family:   . Attends Religious Services:   . Active Member of Clubs or Organizations:   . Attends Archivist Meetings:   Marland Kitchen Marital Status:   Intimate Partner Violence:   . Fear of Current or Ex-Partner:   . Emotionally Abused:   Marland Kitchen Physically Abused:   . Sexually Abused:      Current Outpatient Medications:  .  diltiazem (DILACOR XR) 180 MG 24 hr capsule, Take 180 mg by mouth 2 (two) times daily., Disp: , Rfl:  .  doxazosin (CARDURA) 4 MG tablet, Take 1 tablet (4 mg total) by mouth daily., Disp: 30 tablet, Rfl: 3 .  hydrochlorothiazide (HYDRODIURIL) 25 MG tablet, TAKE ONE TABLET EVERY DAY, Disp: 30 tablet, Rfl: 6 .  ibuprofen (ADVIL,MOTRIN) 800 MG tablet, Take 800 mg by mouth every 8 (eight) hours as needed for pain., Disp: , Rfl:  .  metFORMIN (GLUMETZA) 500 MG (MOD) 24 hr tablet, Take 1,000 mg by mouth 2 (two) times daily with a meal., Disp: , Rfl:  .  omeprazole (PRILOSEC) 40 MG capsule, TAKE 1 CAPSULE BY MOUTH ONCE DAILY, Disp: 90 capsule, Rfl: 3 .  pioglitazone (ACTOS) 30 MG tablet, TAKE ONE TABLET BY MOUTH EVERY DAY, Disp: 30 tablet, Rfl: 6 .  simvastatin (ZOCOR) 20 MG tablet, TAKE ONE TABLET EVERY DAY, Disp: 30 tablet, Rfl: 6   Allergies  Allergen Reactions  . Ace Inhibitors Swelling    ROS Review of Systems  Constitutional: Negative.   HENT: Negative.   Eyes: Negative.   Respiratory: Negative.   Cardiovascular: Negative.   Gastrointestinal: Negative.   Endocrine: Negative.   Genitourinary: Negative.   Musculoskeletal:  Negative.   Skin: Negative.   Allergic/Immunologic: Negative.   Neurological: Negative.   Hematological: Negative.   Psychiatric/Behavioral: Negative.   All other systems reviewed and are negative.    OBJECTIVE:    Physical Exam Vitals reviewed.  Constitutional:      Appearance: Normal appearance.  HENT:     Nose: Nose normal.     Mouth/Throat:     Mouth: Mucous membranes are moist.  Eyes:     Pupils: Pupils are equal, round, and reactive to light.  Neck:     Vascular: No carotid bruit.  Cardiovascular:     Rate and Rhythm: Normal rate and regular rhythm.     Pulses: Normal pulses.     Heart sounds: Normal heart sounds.  Pulmonary:     Effort: Pulmonary effort is normal.     Breath sounds: Rhonchi present.  Abdominal:     General: Bowel sounds are normal.     Palpations: Abdomen is soft. There is no hepatomegaly or splenomegaly.     Tenderness: There is no abdominal tenderness.     Hernia: No hernia is present.  Musculoskeletal:     Right lower leg: No edema.     Left lower leg: No edema.  Skin:    Findings: No rash.  Neurological:     Mental Status: He is alert and oriented to person, place, and time.  Psychiatric:        Mood and Affect: Mood normal.        Behavior: Behavior normal.     BP (!) 152/89   Pulse (!) 103   Ht 5\' 7"  (1.702 m)   Wt 195 lb 8 oz (88.7 kg)   BMI 30.62 kg/m  Wt Readings from Last 3 Encounters:  11/05/19 195 lb 8 oz (88.7 kg)  11/02/19 194 lb 9.6 oz (88.3 kg)  07/12/16 187 lb (84.8 kg)    Health Maintenance Due  Topic Date Due  . HEMOGLOBIN A1C  Never done  . Hepatitis C Screening  Never done  . PNEUMOCOCCAL POLYSACCHARIDE VACCINE AGE 70-64 HIGH RISK  Never done  . FOOT EXAM  Never done  . OPHTHALMOLOGY EXAM  Never done  . COVID-19 Vaccine (1) Never done  . HIV Screening  Never done  . TETANUS/TDAP  Never done    There are no preventive care reminders to display for this patient.  CBC Latest Ref Rng & Units 03/01/2013    WBC 3.8 - 10.6 x10 3/mm 3 12.6(H)  Hemoglobin 13.0 - 18.0 g/dL 15.3  Hematocrit 40.0 - 52.0 % 44.2  Platelets  150 - 440 x10 3/mm 3 323   CMP Latest Ref Rng & Units 03/01/2013  Glucose 65 - 99 mg/dL 136(H)  BUN 7 - 18 mg/dL 13  Creatinine 0.60 - 1.30 mg/dL 0.91  Sodium 136 - 145 mmol/L 139  Potassium 3.5 - 5.1 mmol/L 4.2  Chloride 98 - 107 mmol/L 106  CO2 21 - 32 mmol/L 25  Calcium 8.5 - 10.1 mg/dL 9.0    No results found for: TSH Lab Results  Component Value Date   ANIONGAP 8 03/01/2013   No results found for: CHOL, HDL, LDLCALC, CHOLHDL No results found for: TRIG No results found for: HGBA1C    ASSESSMENT & PLAN:   Problem List Items Addressed This Visit      Cardiovascular and Mediastinum   Essential hypertension    Blood pressure is down to 152/89 today/patient was advised to cut down on the drinking to 5 beers a week.  Presently been drinking up to 1 case of the week.  Especially over the weekend or day of.  He was also advised to stop smoking.        Respiratory   Bronchitis    Patient was advised to stop smoking.        Endocrine   Type 2 diabetes mellitus ([place]) - Primary    Was found to be high consistently.  He says he is taking his medication as directed I think that his main source of calories with heavy  beer intake.  We will check his blood sugar again in 6 weeks      Relevant Orders   POCT glucose (manual entry) (Completed)     Other   Hyperlipidemia    Patient is taking his statin regularly.         Type 2 diabetes mellitus with other specified complication, unspecified whether long term insulin use ([place]) - Plan: POCT glucose (manual entry)  Essential hypertension  Bronchitis  Mixed hyperlipidemia  No orders of the defined types were placed in this encounter.  I personally performed the services described in this documentation, which was SCRIBED in my presence. The recorded information has been reviewed and considered accurate. It has  been edited as necessary during review. Cletis Athens, MD     Follow-up: Return in about 6 weeks (around 12/17/2019).    Dr. Jane Canary [facility] [address], [place], [place] [address]   By signing my name below, I, [employer], attest that this documentation has been prepared under the direction and in the presence of Cletis Athens, MD. Electronically Signed: Cletis Athens, MD 11/05/19, 4:06 PM   I personally performed the services described in this documentation, which was SCRIBED in my presence. The recorded information has been reviewed and considered accurate. It has been edited as necessary during review. Cletis Athens, MD

## 2019-11-05 NOTE — Assessment & Plan Note (Signed)
Was found to be high consistently.  He says he is taking his medication as directed I think that his main source of calories with heavy  beer intake.  We will check his blood sugar again in 6 weeks

## 2019-11-05 NOTE — Patient Instructions (Addendum)
Smoking Tobacco Information, Adult °Smoking tobacco can be harmful to your health. Tobacco contains a poisonous (toxic), colorless chemical called nicotine. Nicotine is addictive. It changes the brain and can make it hard to stop smoking. Tobacco also has other toxic chemicals that can hurt your body and raise your risk of many cancers.  °How can smoking tobacco affect me? °Smoking tobacco puts you at risk for: °· Cancer. Smoking is most commonly associated with lung cancer, but can also lead to cancer in other parts of the body. °· Chronic obstructive pulmonary disease (COPD). This is a long-term lung condition that makes it hard to breathe. It also gets worse over time. °· High blood pressure (hypertension), heart disease, stroke, or heart attack. °· Lung infections, such as pneumonia. °· Cataracts. This is when the lenses in the eyes become clouded. °· Digestive problems. This may include peptic ulcers, heartburn, and gastroesophageal reflux disease (GERD). °· Oral health problems, such as gum disease and tooth loss. °· Loss of taste and smell. °Smoking can affect your appearance by causing: °· Wrinkles. °· Yellow or stained teeth, fingers, and fingernails. °Smoking tobacco can also affect your social life, because: °1. It may be challenging to find places to smoke when away from home. Many workplaces, restaurants, hotels, and public places are tobacco-free. °2. Smoking is expensive. This is due to the cost of tobacco and the long-term costs of treating health problems from smoking. °3. Secondhand smoke may affect those around you. Secondhand smoke can cause lung cancer, breathing problems, and heart disease. Children of smokers have a higher risk for: °? Sudden infant death syndrome (SIDS). °? Ear infections. °? Lung infections. °If you currently smoke tobacco, quitting now can help you: °· Lead a longer and healthier life. °· Look, smell, breathe, and feel better over time. °· Save money. °· Protect others from  the harms of secondhand smoke. °What actions can I take to prevent health problems? °Quit smoking ° °· Do not start smoking. Quit if you already do. °· Make a plan to quit smoking and commit to it. Look for programs to help you and ask your health care provider for recommendations and ideas. °· Set a date and write down all the reasons you want to quit. °· Let your friends and family know you are quitting so they can help and support you. Consider finding friends who also want to quit. It can be easier to quit with someone else, so that you can support each other. °· Talk with your health care provider about using nicotine replacement medicines to help you quit, such as gum, lozenges, patches, sprays, or pills. °· Do not replace cigarette smoking with electronic cigarettes, which are commonly called e-cigarettes. The safety of e-cigarettes is not known, and some may contain harmful chemicals. °· If you try to quit but return to smoking, stay positive. It is common to slip up when you first quit, so take it one day at a time. °· Be prepared for cravings. When you feel the urge to smoke, chew gum or suck on hard candy. °Lifestyle °· Stay busy and take care of your body. °· Drink enough fluid to keep your urine pale yellow. °· Get plenty of exercise and eat a healthy diet. This can help prevent weight gain after quitting. °· Monitor your eating habits. Quitting smoking can cause you to have a larger appetite than when you smoke. °· Find ways to relax. Go out with friends or family to a movie or a   restaurant where people do not smoke. °· Ask your health care provider about having regular tests (screenings) to check for cancer. This may include blood tests, imaging tests, and other tests. °· Find ways to manage your stress, such as meditation, yoga, or exercise. °Where to find support °To get support to quit smoking, consider: °· Asking your health care provider for more information and resources. °· Taking classes to  learn more about quitting smoking. °· Looking for local organizations that offer resources about quitting smoking. °· Joining a support group for people who want to quit smoking in your local community. °· Calling the smokefree.gov counselor helpline: 1-800-Quit-Now (1-800-784-8669) °Where to find more information °You may find more information about quitting smoking from: °· HelpGuide.org: www.helpguide.org °· Smokefree.gov: smokefree.gov °· American Lung Association: www.lung.org °Contact a health care provider if you: °· Have problems breathing. °· Notice that your lips, nose, or fingers turn blue. °· Have chest pain. °· Are coughing up blood. °· Feel faint or you pass out. °· Have other health changes that cause you to worry. °Summary °· Smoking tobacco can negatively affect your health, the health of those around you, your finances, and your social life. °· Do not start smoking. Quit if you already do. If you need help quitting, ask your health care provider. °· Think about joining a support group for people who want to quit smoking in your local community. There are many effective programs that will help you to quit this behavior. °This information is not intended to replace advice given to you by your health care provider. Make sure you discuss any questions you have with your health care provider. °Document Revised: 01/22/2019 Document Reviewed: 05/14/2016 °Elsevier Patient Education © 2020 [employer] Inc[employer] ° ° °

## 2019-11-05 NOTE — Assessment & Plan Note (Signed)
Patient was advised to stop smoking 

## 2019-11-05 NOTE — Assessment & Plan Note (Signed)
Blood pressure is down to 152/89 today/patient was advised to cut down on the drinking to 5 beers a week.  Presently been drinking up to 1 case of the week.  Especially over the weekend or day of.  He was also advised to stop smoking.

## 2019-11-05 NOTE — Assessment & Plan Note (Signed)
Patient is taking his statin regularly.

## 2019-12-07 DIAGNOSIS — L728 Other follicular cysts of the skin and subcutaneous tissue: Secondary | ICD-10-CM | POA: Diagnosis not present

## 2019-12-08 DIAGNOSIS — H353221 Exudative age-related macular degeneration, left eye, with active choroidal neovascularization: Secondary | ICD-10-CM | POA: Diagnosis not present

## 2019-12-10 ENCOUNTER — Ambulatory Visit: Payer: BC Managed Care – PPO | Admitting: Internal Medicine

## 2019-12-15 ENCOUNTER — Other Ambulatory Visit: Payer: Self-pay | Admitting: Internal Medicine

## 2019-12-20 DIAGNOSIS — H2512 Age-related nuclear cataract, left eye: Secondary | ICD-10-CM | POA: Diagnosis not present

## 2020-02-11 ENCOUNTER — Other Ambulatory Visit: Payer: Self-pay | Admitting: Internal Medicine

## 2020-02-14 ENCOUNTER — Other Ambulatory Visit: Payer: Self-pay | Admitting: Internal Medicine

## 2020-02-14 ENCOUNTER — Other Ambulatory Visit: Payer: Self-pay | Admitting: *Deleted

## 2020-02-14 MED ORDER — BETAMETHASONE DIPROPIONATE 0.05 % EX OINT: TOPICAL_OINTMENT | CUTANEOUS | 6 refills | Status: DC

## 2020-02-16 DIAGNOSIS — H353221 Exudative age-related macular degeneration, left eye, with active choroidal neovascularization: Secondary | ICD-10-CM | POA: Diagnosis not present

## 2020-02-24 ENCOUNTER — Other Ambulatory Visit: Payer: Self-pay | Admitting: Internal Medicine

## 2020-02-24 ENCOUNTER — Ambulatory Visit: Payer: BC Managed Care – PPO | Admitting: Family Medicine

## 2020-02-25 ENCOUNTER — Other Ambulatory Visit: Payer: Self-pay

## 2020-02-25 ENCOUNTER — Other Ambulatory Visit: Payer: Self-pay | Admitting: Internal Medicine

## 2020-02-25 MED ORDER — METFORMIN HCL ER (MOD) 500 MG PO TB24: 1000.0000 mg | ORAL_TABLET | Freq: Two times a day (BID) | ORAL | 3 refills | Status: DC

## 2020-02-25 MED ORDER — BASAGLAR KWIKPEN 100 UNIT/ML ~~LOC~~ SOPN: PEN_INJECTOR | SUBCUTANEOUS | 6 refills | Status: DC

## 2020-02-28 ENCOUNTER — Other Ambulatory Visit: Payer: Self-pay | Admitting: *Deleted

## 2020-02-28 MED ORDER — METFORMIN HCL ER 500 MG PO TB24
1000.0000 mg | ORAL_TABLET | Freq: Two times a day (BID) | ORAL | 5 refills | Status: DC
Start: 2020-02-28 — End: 2020-09-15

## 2020-03-10 ENCOUNTER — Other Ambulatory Visit: Payer: Self-pay | Admitting: Internal Medicine

## 2020-03-10 DIAGNOSIS — I10 Essential (primary) hypertension: Secondary | ICD-10-CM

## 2020-04-07 ENCOUNTER — Other Ambulatory Visit: Payer: Self-pay | Admitting: Internal Medicine

## 2020-04-15 ENCOUNTER — Other Ambulatory Visit: Payer: Self-pay | Admitting: Internal Medicine

## 2020-04-24 DIAGNOSIS — E119 Type 2 diabetes mellitus without complications: Secondary | ICD-10-CM | POA: Diagnosis not present

## 2020-04-24 DIAGNOSIS — H2512 Age-related nuclear cataract, left eye: Secondary | ICD-10-CM | POA: Diagnosis not present

## 2020-04-24 DIAGNOSIS — Z01818 Encounter for other preprocedural examination: Secondary | ICD-10-CM | POA: Diagnosis not present

## 2020-05-17 DIAGNOSIS — H353221 Exudative age-related macular degeneration, left eye, with active choroidal neovascularization: Secondary | ICD-10-CM | POA: Diagnosis not present

## 2020-05-31 DIAGNOSIS — H2512 Age-related nuclear cataract, left eye: Secondary | ICD-10-CM | POA: Diagnosis not present

## 2020-06-08 ENCOUNTER — Other Ambulatory Visit: Payer: Self-pay | Admitting: Family Medicine

## 2020-06-09 LAB — CMP12+LP+TP+TSH+6AC+PSA+CBC…
ALT: 26 IU/L (ref 0–44)
AST: 26 IU/L (ref 0–40)
Albumin/Globulin Ratio: 1.6 (ref 1.2–2.2)
Albumin: 4.6 g/dL (ref 3.8–4.9)
Alkaline Phosphatase: 84 IU/L (ref 44–121)
BUN/Creatinine Ratio: 13 (ref 9–20)
BUN: 12 mg/dL (ref 6–24)
Basophils Absolute: 0.1 10*3/uL (ref 0.0–0.2)
Basos: 1 %
Bilirubin Total: 0.4 mg/dL (ref 0.0–1.2)
Calcium: 9.5 mg/dL (ref 8.7–10.2)
Chloride: 99 mmol/L (ref 96–106)
Chol/HDL Ratio: 2.6 ratio (ref 0.0–5.0)
Cholesterol, Total: 212 mg/dL — ABNORMAL HIGH (ref 100–199)
Creatinine, Ser: 0.92 mg/dL (ref 0.76–1.27)
EOS (ABSOLUTE): 0.2 10*3/uL (ref 0.0–0.4)
Eos: 2 %
Estimated CHD Risk: <0.5 times avg. (ref 0.0–1.0)
Free Thyroxine Index: 2.3 (ref 1.2–4.9)
GFR calc Af Amer: 105 mL/min/{1.73_m2} (ref >59)
GFR calc non Af Amer: 91 mL/min/{1.73_m2} (ref >59)
GGT: 129 IU/L — ABNORMAL HIGH (ref 0–65)
Globulin, Total: 2.8 g/dL (ref 1.5–4.5)
Glucose: 215 mg/dL — ABNORMAL HIGH (ref 65–99)
HDL: 81 mg/dL (ref >39)
Hematocrit: 44.8 % (ref 37.5–51.0)
Hemoglobin: 15.1 g/dL (ref 13.0–17.7)
Immature Grans (Abs): 0 10*3/uL (ref 0.0–0.1)
Immature Granulocytes: 0 %
Iron: 134 ug/dL (ref 38–169)
LDH: 182 IU/L (ref 121–224)
LDL Chol Calc (NIH): 106 mg/dL — ABNORMAL HIGH (ref 0–99)
Lymphocytes Absolute: 2.3 10*3/uL (ref 0.7–3.1)
Lymphs: 25 %
MCH: 30.4 pg (ref 26.6–33.0)
MCHC: 33.7 g/dL (ref 31.5–35.7)
MCV: 90 fL (ref 79–97)
Monocytes Absolute: 0.9 10*3/uL (ref 0.1–0.9)
Monocytes: 10 %
Neutrophils Absolute: 5.5 10*3/uL (ref 1.4–7.0)
Neutrophils: 62 %
Phosphorus: 3.9 mg/dL (ref 2.8–4.1)
Platelets: 335 10*3/uL (ref 150–450)
Potassium: 3.8 mmol/L (ref 3.5–5.2)
Prostate Specific Ag, Serum: 0.3 ng/mL (ref 0.0–4.0)
RBC: 4.96 x10E6/uL (ref 4.14–5.80)
RDW: 13.3 % (ref 11.6–15.4)
Sodium: 141 mmol/L (ref 134–144)
T3 Uptake Ratio: 29 % (ref 24–39)
T4, Total: 7.8 ug/dL (ref 4.5–12.0)
TSH: 1.48 u[IU]/mL (ref 0.450–4.500)
Total Protein: 7.4 g/dL (ref 6.0–8.5)
Triglycerides: 146 mg/dL (ref 0–149)
Uric Acid: 5 mg/dL (ref 3.8–8.4)
VLDL Cholesterol Cal: 25 mg/dL (ref 5–40)
WBC: 9.1 10*3/uL (ref 3.4–10.8)

## 2020-06-14 DIAGNOSIS — H2511 Age-related nuclear cataract, right eye: Secondary | ICD-10-CM | POA: Diagnosis not present

## 2020-06-27 ENCOUNTER — Other Ambulatory Visit: Payer: Self-pay

## 2020-07-12 DIAGNOSIS — H353221 Exudative age-related macular degeneration, left eye, with active choroidal neovascularization: Secondary | ICD-10-CM | POA: Diagnosis not present

## 2020-09-06 DIAGNOSIS — H353221 Exudative age-related macular degeneration, left eye, with active choroidal neovascularization: Secondary | ICD-10-CM | POA: Diagnosis not present

## 2020-09-15 ENCOUNTER — Other Ambulatory Visit: Payer: Self-pay | Admitting: Internal Medicine

## 2020-10-03 DIAGNOSIS — H5213 Myopia, bilateral: Secondary | ICD-10-CM | POA: Diagnosis not present

## 2020-11-01 DIAGNOSIS — H353221 Exudative age-related macular degeneration, left eye, with active choroidal neovascularization: Secondary | ICD-10-CM | POA: Diagnosis not present

## 2020-11-14 ENCOUNTER — Other Ambulatory Visit: Payer: Self-pay | Admitting: Internal Medicine

## 2020-12-16 ENCOUNTER — Other Ambulatory Visit: Payer: Self-pay | Admitting: Internal Medicine

## 2021-02-12 ENCOUNTER — Other Ambulatory Visit: Payer: Self-pay | Admitting: Internal Medicine

## 2021-04-03 ENCOUNTER — Other Ambulatory Visit: Payer: Self-pay | Admitting: Internal Medicine

## 2021-04-22 ENCOUNTER — Ambulatory Visit (HOSPITAL_COMMUNITY)
Admission: AD | Admit: 2021-04-22 | Discharge: 2021-04-22 | Disposition: A | Discharge status: Home / Self Care | Source: Other Acute Inpatient Hospital | Attending: Emergency Medicine | Admitting: Emergency Medicine

## 2021-04-22 ENCOUNTER — Emergency Department
Admission: EM | Admit: 2021-04-22 | Discharge: 2021-04-22 | Disposition: A | Discharge status: Other Acute Inpatient Hospital | Attending: Emergency Medicine | Admitting: Emergency Medicine

## 2021-04-22 ENCOUNTER — Other Ambulatory Visit: Payer: Self-pay

## 2021-04-22 DIAGNOSIS — Y990 Civilian activity done for income or pay: Secondary | ICD-10-CM | POA: Insufficient documentation

## 2021-04-22 DIAGNOSIS — Y9289 Other specified places as the place of occurrence of the external cause: Secondary | ICD-10-CM | POA: Diagnosis not present

## 2021-04-22 DIAGNOSIS — T2020XA Burn of second degree of head, face, and neck, unspecified site, initial encounter: Secondary | ICD-10-CM

## 2021-04-22 DIAGNOSIS — X088XXA Exposure to other specified smoke, fire and flames, initial encounter: Secondary | ICD-10-CM | POA: Diagnosis not present

## 2021-04-22 DIAGNOSIS — Z7984 Long term (current) use of oral hypoglycemic drugs: Secondary | ICD-10-CM | POA: Insufficient documentation

## 2021-04-22 DIAGNOSIS — T22031A Burn of unspecified degree of right upper arm, initial encounter: Secondary | ICD-10-CM | POA: Diagnosis not present

## 2021-04-22 DIAGNOSIS — T2122XA Burn of second degree of abdominal wall, initial encounter: Secondary | ICD-10-CM

## 2021-04-22 DIAGNOSIS — Z794 Long term (current) use of insulin: Secondary | ICD-10-CM | POA: Diagnosis not present

## 2021-04-22 DIAGNOSIS — T2220XA Burn of second degree of shoulder and upper limb, except wrist and hand, unspecified site, initial encounter: Secondary | ICD-10-CM | POA: Insufficient documentation

## 2021-04-22 DIAGNOSIS — T22231A Burn of second degree of right upper arm, initial encounter: Secondary | ICD-10-CM | POA: Diagnosis not present

## 2021-04-22 DIAGNOSIS — Y939 Activity, unspecified: Secondary | ICD-10-CM | POA: Diagnosis not present

## 2021-04-22 DIAGNOSIS — E119 Type 2 diabetes mellitus without complications: Secondary | ICD-10-CM | POA: Insufficient documentation

## 2021-04-22 DIAGNOSIS — Z79899 Other long term (current) drug therapy: Secondary | ICD-10-CM | POA: Insufficient documentation

## 2021-04-22 DIAGNOSIS — X12XXXA Contact with other hot fluids, initial encounter: Secondary | ICD-10-CM | POA: Diagnosis not present

## 2021-04-22 DIAGNOSIS — F17210 Nicotine dependence, cigarettes, uncomplicated: Secondary | ICD-10-CM | POA: Insufficient documentation

## 2021-04-22 DIAGNOSIS — Z20822 Contact with and (suspected) exposure to covid-19: Secondary | ICD-10-CM | POA: Insufficient documentation

## 2021-04-22 DIAGNOSIS — I10 Essential (primary) hypertension: Secondary | ICD-10-CM | POA: Diagnosis not present

## 2021-04-22 DIAGNOSIS — Y929 Unspecified place or not applicable: Secondary | ICD-10-CM | POA: Insufficient documentation

## 2021-04-22 DIAGNOSIS — Z23 Encounter for immunization: Secondary | ICD-10-CM | POA: Insufficient documentation

## 2021-04-22 DIAGNOSIS — T310 Burns involving less than 10% of body surface: Secondary | ICD-10-CM | POA: Diagnosis not present

## 2021-04-22 LAB — BASIC METABOLIC PANEL
Anion gap: 11 (ref 5–15)
BUN: 13 mg/dL (ref 6–20)
CO2: 25 mmol/L (ref 22–32)
Calcium: 9.1 mg/dL (ref 8.9–10.3)
Chloride: 99 mmol/L (ref 98–111)
Creatinine, Ser: 0.85 mg/dL (ref 0.61–1.24)
GFR, Estimated: >60 mL/min (ref >60)
Glucose, Bld: 206 mg/dL — ABNORMAL HIGH (ref 70–99)
Potassium: 3.9 mmol/L (ref 3.5–5.1)
Sodium: 135 mmol/L (ref 135–145)

## 2021-04-22 LAB — CBC WITH DIFFERENTIAL/PLATELET
Abs Immature Granulocytes: 0.05 10*3/uL (ref 0.00–0.07)
Basophils Absolute: 0.1 10*3/uL (ref 0.0–0.1)
Basophils Relative: 1 %
Eosinophils Absolute: 0.2 10*3/uL (ref 0.0–0.5)
Eosinophils Relative: 1 %
HCT: 43.4 % (ref 39.0–52.0)
Hemoglobin: 15 g/dL (ref 13.0–17.0)
Immature Granulocytes: 0 %
Lymphocytes Relative: 18 %
Lymphs Abs: 2.2 10*3/uL (ref 0.7–4.0)
MCH: 31.9 pg (ref 26.0–34.0)
MCHC: 34.6 g/dL (ref 30.0–36.0)
MCV: 92.3 fL (ref 80.0–100.0)
Monocytes Absolute: 1.1 10*3/uL — ABNORMAL HIGH (ref 0.1–1.0)
Monocytes Relative: 8 %
Neutro Abs: 9.1 10*3/uL — ABNORMAL HIGH (ref 1.7–7.7)
Neutrophils Relative %: 72 %
Platelets: 329 10*3/uL (ref 150–400)
RBC: 4.7 MIL/uL (ref 4.22–5.81)
RDW: 12.7 % (ref 11.5–15.5)
WBC: 12.7 10*3/uL — ABNORMAL HIGH (ref 4.0–10.5)
nRBC: 0 % (ref 0.0–0.2)

## 2021-04-22 LAB — RESP PANEL BY RT-PCR (FLU A&B, COVID) ARPGX2
Influenza A by PCR: NEGATIVE
Influenza B by PCR: NEGATIVE
SARS Coronavirus 2 by RT PCR: NEGATIVE

## 2021-04-22 LAB — CBG MONITORING, ED: Glucose-Capillary: 199 mg/dL — ABNORMAL HIGH (ref 70–99)

## 2021-04-22 MED ORDER — TETANUS-DIPHTH-ACELL PERTUSSIS 5-2.5-18.5 LF-MCG/0.5 IM SUSY
0.5000 mL | PREFILLED_SYRINGE | Freq: Once | INTRAMUSCULAR | Status: AC
  Administered 2021-04-22: 0.5 mL via INTRAMUSCULAR
  Filled 2021-04-22: qty 0.5

## 2021-04-22 MED ORDER — SODIUM CHLORIDE 0.9 % IV SOLN: Freq: Once | INTRAVENOUS | Status: AC

## 2021-04-22 NOTE — ED Triage Notes (Signed)
Report given to [facility] at [facility]
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
## 2021-04-23 DIAGNOSIS — T310 Burns involving less than 10% of body surface: Secondary | ICD-10-CM | POA: Diagnosis not present

## 2021-04-23 DIAGNOSIS — E1165 Type 2 diabetes mellitus with hyperglycemia: Secondary | ICD-10-CM | POA: Diagnosis not present

## 2021-04-23 DIAGNOSIS — Z7984 Long term (current) use of oral hypoglycemic drugs: Secondary | ICD-10-CM | POA: Diagnosis not present

## 2021-04-23 DIAGNOSIS — F17210 Nicotine dependence, cigarettes, uncomplicated: Secondary | ICD-10-CM | POA: Diagnosis not present

## 2021-04-24 DIAGNOSIS — T310 Burns involving less than 10% of body surface: Secondary | ICD-10-CM | POA: Diagnosis not present

## 2021-04-24 DIAGNOSIS — E1165 Type 2 diabetes mellitus with hyperglycemia: Secondary | ICD-10-CM | POA: Diagnosis not present

## 2021-04-24 DIAGNOSIS — Z7984 Long term (current) use of oral hypoglycemic drugs: Secondary | ICD-10-CM | POA: Diagnosis not present

## 2021-06-22 DIAGNOSIS — Z6829 Body mass index (BMI) 29.0-29.9, adult: Secondary | ICD-10-CM | POA: Diagnosis not present

## 2021-06-22 DIAGNOSIS — T2122XD Burn of second degree of abdominal wall, subsequent encounter: Secondary | ICD-10-CM | POA: Diagnosis not present

## 2021-06-22 DIAGNOSIS — X19XXXD Contact with other heat and hot substances, subsequent encounter: Secondary | ICD-10-CM | POA: Diagnosis not present

## 2021-06-22 DIAGNOSIS — T310 Burns involving less than 10% of body surface: Secondary | ICD-10-CM | POA: Diagnosis not present

## 2021-06-22 DIAGNOSIS — T22291D Burn of second degree of multiple sites of right shoulder and upper limb, except wrist and hand, subsequent encounter: Secondary | ICD-10-CM | POA: Diagnosis not present

## 2021-06-25 ENCOUNTER — Other Ambulatory Visit: Payer: Self-pay | Admitting: Internal Medicine

## 2021-08-20 ENCOUNTER — Other Ambulatory Visit: Payer: Self-pay | Admitting: Internal Medicine

## 2021-09-11 ENCOUNTER — Emergency Department
Admission: EM | Admit: 2021-09-11 | Discharge: 2021-09-12 | Disposition: A | Discharge status: Home / Self Care | Payer: BC Managed Care – PPO | Attending: Emergency Medicine | Admitting: Emergency Medicine

## 2021-09-11 ENCOUNTER — Emergency Department: Payer: BC Managed Care – PPO

## 2021-09-11 ENCOUNTER — Other Ambulatory Visit: Payer: Self-pay

## 2021-09-11 DIAGNOSIS — M25552 Pain in left hip: Secondary | ICD-10-CM | POA: Insufficient documentation

## 2021-09-11 DIAGNOSIS — S29001A Unspecified injury of muscle and tendon of front wall of thorax, initial encounter: Secondary | ICD-10-CM | POA: Diagnosis not present

## 2021-09-11 DIAGNOSIS — W01198A Fall on same level from slipping, tripping and stumbling with subsequent striking against other object, initial encounter: Secondary | ICD-10-CM | POA: Insufficient documentation

## 2021-09-11 DIAGNOSIS — R739 Hyperglycemia, unspecified: Secondary | ICD-10-CM

## 2021-09-11 DIAGNOSIS — I2510 Atherosclerotic heart disease of native coronary artery without angina pectoris: Secondary | ICD-10-CM | POA: Diagnosis not present

## 2021-09-11 DIAGNOSIS — I10 Essential (primary) hypertension: Secondary | ICD-10-CM | POA: Insufficient documentation

## 2021-09-11 DIAGNOSIS — S2242XA Multiple fractures of ribs, left side, initial encounter for closed fracture: Secondary | ICD-10-CM | POA: Insufficient documentation

## 2021-09-11 DIAGNOSIS — M79652 Pain in left thigh: Secondary | ICD-10-CM | POA: Diagnosis not present

## 2021-09-11 DIAGNOSIS — W19XXXA Unspecified fall, initial encounter: Secondary | ICD-10-CM

## 2021-09-11 DIAGNOSIS — M79672 Pain in left foot: Secondary | ICD-10-CM | POA: Diagnosis not present

## 2021-09-11 DIAGNOSIS — S92525B Nondisplaced fracture of medial phalanx of left lesser toe(s), initial encounter for open fracture: Secondary | ICD-10-CM | POA: Diagnosis not present

## 2021-09-11 DIAGNOSIS — L03032 Cellulitis of left toe: Secondary | ICD-10-CM | POA: Diagnosis not present

## 2021-09-11 DIAGNOSIS — T07XXXA Unspecified multiple injuries, initial encounter: Secondary | ICD-10-CM

## 2021-09-11 DIAGNOSIS — F17200 Nicotine dependence, unspecified, uncomplicated: Secondary | ICD-10-CM | POA: Insufficient documentation

## 2021-09-11 DIAGNOSIS — M1612 Unilateral primary osteoarthritis, left hip: Secondary | ICD-10-CM | POA: Diagnosis not present

## 2021-09-11 DIAGNOSIS — M79662 Pain in left lower leg: Secondary | ICD-10-CM | POA: Diagnosis not present

## 2021-09-11 DIAGNOSIS — S3991XA Unspecified injury of abdomen, initial encounter: Secondary | ICD-10-CM | POA: Diagnosis not present

## 2021-09-11 DIAGNOSIS — K3189 Other diseases of stomach and duodenum: Secondary | ICD-10-CM | POA: Diagnosis not present

## 2021-09-11 DIAGNOSIS — E1165 Type 2 diabetes mellitus with hyperglycemia: Secondary | ICD-10-CM | POA: Insufficient documentation

## 2021-09-11 DIAGNOSIS — J439 Emphysema, unspecified: Secondary | ICD-10-CM | POA: Diagnosis not present

## 2021-09-11 DIAGNOSIS — I700 Atherosclerosis of aorta: Secondary | ICD-10-CM | POA: Diagnosis not present

## 2021-09-11 LAB — CBC WITH DIFFERENTIAL/PLATELET
Abs Immature Granulocytes: 0.03 10*3/uL (ref 0.00–0.07)
Basophils Absolute: 0.1 10*3/uL (ref 0.0–0.1)
Basophils Relative: 1 %
Eosinophils Absolute: 0.4 10*3/uL (ref 0.0–0.5)
Eosinophils Relative: 5 %
HCT: 41 % (ref 39.0–52.0)
Hemoglobin: 13.9 g/dL (ref 13.0–17.0)
Immature Granulocytes: 0 %
Lymphocytes Relative: 33 %
Lymphs Abs: 3 10*3/uL (ref 0.7–4.0)
MCH: 30.2 pg (ref 26.0–34.0)
MCHC: 33.9 g/dL (ref 30.0–36.0)
MCV: 88.9 fL (ref 80.0–100.0)
Monocytes Absolute: 0.9 10*3/uL (ref 0.1–1.0)
Monocytes Relative: 10 %
Neutro Abs: 4.6 10*3/uL (ref 1.7–7.7)
Neutrophils Relative %: 51 %
Platelets: 282 10*3/uL (ref 150–400)
RBC: 4.61 MIL/uL (ref 4.22–5.81)
RDW: 13.5 % (ref 11.5–15.5)
WBC: 8.9 10*3/uL (ref 4.0–10.5)
nRBC: 0 % (ref 0.0–0.2)

## 2021-09-11 LAB — BASIC METABOLIC PANEL
Anion gap: 13 (ref 5–15)
BUN: 9 mg/dL (ref 8–23)
CO2: 24 mmol/L (ref 22–32)
Calcium: 9 mg/dL (ref 8.9–10.3)
Chloride: 98 mmol/L (ref 98–111)
Creatinine, Ser: 0.68 mg/dL (ref 0.61–1.24)
GFR, Estimated: >60 mL/min (ref >60)
Glucose, Bld: 217 mg/dL — ABNORMAL HIGH (ref 70–99)
Potassium: 3.5 mmol/L (ref 3.5–5.1)
Sodium: 135 mmol/L (ref 135–145)

## 2021-09-11 MED ORDER — KETOROLAC TROMETHAMINE 30 MG/ML IJ SOLN
15.0000 mg | Freq: Once | INTRAMUSCULAR | Status: AC
  Administered 2021-09-11: 15 mg via INTRAVENOUS
  Filled 2021-09-11: qty 1

## 2021-09-11 MED ORDER — OXYCODONE-ACETAMINOPHEN 5-325 MG PO TABS: 1.0000 | ORAL_TABLET | Freq: Three times a day (TID) | ORAL | 0 refills | Status: AC | PRN

## 2021-09-11 MED ORDER — MORPHINE SULFATE (PF) 4 MG/ML IV SOLN
4.0000 mg | Freq: Once | INTRAVENOUS | Status: AC
Start: 2021-09-11 — End: 2021-09-11
  Administered 2021-09-11: 4 mg via INTRAVENOUS
  Filled 2021-09-11: qty 1

## 2021-09-11 MED ORDER — OXYCODONE HCL 5 MG PO TABS
5.0000 mg | ORAL_TABLET | ORAL | Status: DC
Start: 2021-09-11 — End: 2021-09-11

## 2021-09-11 MED ORDER — AMOXICILLIN-POT CLAVULANATE 875-125 MG PO TABS: 1.0000 | ORAL_TABLET | Freq: Two times a day (BID) | ORAL | 0 refills | Status: AC

## 2021-09-11 MED ORDER — MORPHINE SULFATE (PF) 4 MG/ML IV SOLN
4.0000 mg | Freq: Once | INTRAVENOUS | Status: AC
  Administered 2021-09-11: 4 mg via INTRAVENOUS
  Filled 2021-09-11: qty 1

## 2021-09-11 MED ORDER — VANCOMYCIN HCL 2000 MG/400ML IV SOLN
2000.0000 mg | Freq: Once | INTRAVENOUS | Status: AC
  Administered 2021-09-11: 2000 mg via INTRAVENOUS
  Filled 2021-09-11: qty 400

## 2021-09-11 MED ORDER — IOHEXOL 300 MG/ML  SOLN
100.0000 mL | Freq: Once | INTRAMUSCULAR | Status: AC | PRN
  Administered 2021-09-11: 100 mL via INTRAVENOUS
  Filled 2021-09-11: qty 100

## 2021-09-11 MED ORDER — AMOXICILLIN-POT CLAVULANATE 875-125 MG PO TABS: 1.0000 | ORAL_TABLET | Freq: Two times a day (BID) | ORAL | 0 refills | Status: DC

## 2021-09-11 NOTE — ED Triage Notes (Signed)
Pt arrives with c/o left sided rib cage pain after a fall. Pt fell down steps on Sunday. Pt denies LOC or hitting his head.  ?

## 2021-09-11 NOTE — ED Provider Triage Note (Signed)
Emergency Medicine Provider Triage Evaluation Note ? ?Mark Hendrix , a 61 y.o. male  was evaluated in triage.  Pt complains of left rib and left hip pain.  Patient had mechanical fall 2 days ago.  Left rib and left hip pain.  Denies any shortness of breath, substernal pain, abdominal pain, back pain.  Patient states that he was taking care of symptoms at home but was informed by his work that if he was not seen these would be an excuse absences.  Patient is here for x-rays but also for a work note. ? ?Review of Systems  ?Positive: Left rib, left hip pain ?Negative: No abdominal pain, chest pain, shortness of breath, radicular symptoms ? ?Physical Exam  ?BP (!) 157/91   Pulse (!) 107   Temp 98 ?F (36.7 ?C)   Resp 18   Ht '5\' 7"'$  (1.702 m)   Wt 86.2 kg   SpO2 95%   BMI 29.76 kg/m?  ?Gen:   Awake, no distress   ?Resp:  Normal effort  ?MSK:   Moves extremities without difficulty.  Patient ambulatory in the left hip but does have a large ecchymosis to the lateral hip with tenderness along the lateral hip.  Tender diffusely along the left anterolateral ribs but no abdominal tenderness. ?Other:   ? ?Medical Decision Making  ?Medically screening exam initiated at 7:00 PM.  Appropriate orders placed.  Mark Hendrix was informed that the remainder of the evaluation will be completed by another provider, this initial triage assessment does not replace that evaluation, and the importance of remaining in the ED until their evaluation is complete. ? ?Patient presents to the ED for left rib and left hip pain after mechanical fall.  Patient was treating symptoms at home but his work informed him that he needed a work note for his absences.  Patient is here for imaging and a work note.  Patient will have x-rays at this time. ?  ?Darletta Moll, PA-C ?09/11/21 1901 ? ?

## 2021-09-11 NOTE — ED Provider Notes (Signed)
? ?[facility] ?Provider Note ? ? ? Event Date/Time  ? First MD Initiated Contact with Patient 09/11/21 2139   ?  (approximate) ? ? ?History  ? ?Fall ? ? ?HPI ? ?Mark Hendrix is a 61 y.o. male with past medical history of HTN, HDL, DM and arthritis who presents coming by spouse for evaluation of persistent pain on the left side of the chest as well as in the left hip and left upper leg and thigh and left foot.  Patient states that 2 ago he was walking outside when he slipped on some wet surface and fell striking some steps on the left side of his body.  He did not hit his head or pass out.  He is not on any blood thinners.  He has no neck pain or midline back pain.  He has no pain in the right side of his body.  No other recent sick symptoms.  Does endorse tobacco abuse but no EtOH or illicit drug use.  He has been taking Tylenol without relief of symptoms.  He does note he has been constipated has not had a bowel movement since this occurred although does not have any significant abdominal pain. ? ?  ?Past Medical History:  ?Diagnosis Date  ? Arthritis   ? Diabetes mellitus without complication ([place])   ? Hyperlipidemia   ? Hypertension   ? ? ? ?Physical Exam  ?Triage Vital Signs: ?ED Triage Vitals  ?Enc Vitals Group  ?   BP 09/11/21 1857 (!) 157/91  ?   Pulse Rate 09/11/21 1857 (!) 107  ?   Resp 09/11/21 1857 18  ?   Temp 09/11/21 1857 98 ?F (36.7 ?C)  ?   Temp src --   ?   SpO2 09/11/21 1857 95 %  ?   Weight 09/11/21 1859 190 lb (86.2 kg)  ?   Height 09/11/21 1859 '5\' 7"'$  (1.702 m)  ?   Head Circumference --   ?   Peak Flow --   ?   Pain Score 09/11/21 1858 10  ?   Pain Loc --   ?   Pain Edu? --   ?   Excl. in [place]? --   ? ? ?Most recent vital signs: ?Vitals:  ? 09/11/21 1857 09/11/21 2215  ?BP: (!) 157/91 (!) 147/91  ?Pulse: (!) 107 97  ?Resp: 18 20  ?Temp: 98 ?F (36.7 ?C)   ?SpO2: 95% 96%  ? ? ?General: Awake, peers fairly uncomfortable. ?CV:  Good peripheral perfusion.  2+ radial pulses.   No murmur. ?Resp:  Normal effort.  Bilaterally. ?Abd:  No distention.  Soft throughout. ?Other:  Tender throughout the left anterior and lateral chest wall.  There is no tenderness along the C/T/L-spine.  There is an ecchymosis and tenderness over the left hip, left femur and left tibia.  There is also a small laceration over the dorsum of the left fifth digit with some surrounding erythema no purulence..  Patient is able to flex and extend this digit.  No other areas of trauma noted to the foot.  Sensation is intact to light touch throughout.  2+ DP pulse.  Cranial nerves face scalp and head are all unremarkable.  Patient otherwise has full strength about the right hemibody and left upper extremity.  He does have pain on ranging the left lower extremity but does have full strength. ? ? ?ED Results / Procedures / Treatments  ?Labs ?(all labs ordered are listed,  but only abnormal results are displayed) ?Labs Reviewed  ?BASIC METABOLIC PANEL - Abnormal; Notable for the following components:  ?    Result Value  ? Glucose, Bld 217 (*)   ? All other components within normal limits  ?CBC WITH DIFFERENTIAL/PLATELET  ? ? ? ?EKG ? ? ? ?RADIOLOGY ? ?X-ray left rib series on my review shows a displaced left ninth through 11th rib fractures but no pneumothorax or effusion.  I also reviewed radiology interpretation. ? ?X-ray of the left hip my interpretation without fracture or dislocation.  I reviewed radiology interpretation and agree with the findings of same. ? ?X-ray of the left foot on my review shows a nondisplaced fifth middle lines of fracture.  I also reviewed radiology's interpretation. ? ?X-ray of the left tib-fib and my review without fracture dislocation.  I also viewed radiology's interpretation. ? ?CT abdomen pelvis on my review shows rib fractures seen on x-ray without any pneumothorax intra-abdominal hematoma hemorrhage or bowel injury or any other acute visceral trauma.  No other acute thoracic abdominal  pelvic process.  I also reviewed radiology's interpretation and agree with the findings of possible gastritis versus underdistention of stomach without any other acute process. ? ? ?PROCEDURES: ? ?Critical Care performed: No ? ?Procedures ? ? ?MEDICATIONS ORDERED IN ED: ?Medications  ?morphine (PF) 4 MG/ML injection 4 mg (has no administration in time range)  ?vancomycin (VANCOREADY) IVPB 2000 mg/400 mL (has no administration in time range)  ?ketorolac (TORADOL) 30 MG/ML injection 15 mg (has no administration in time range)  ?morphine (PF) 4 MG/ML injection 4 mg (4 mg Intravenous Given 09/11/21 2208)  ?iohexol (OMNIPAQUE) 300 MG/ML solution 100 mL (100 mLs Intravenous Contrast Given 09/11/21 2251)  ? ? ? ?IMPRESSION / MDM / ASSESSMENT AND PLAN / ED COURSE  ?I reviewed the triage vital signs and the nursing notes. ?             ?               ? ?Differential diagnosis includes, but is not limited to rib fracture, pneumothorax, hemothorax, possible bowel injury given patient reports severe constipation since this occurred as well as possible hip fracture and underlying fracture of the tibia and left foot.  Patient has a small laceration of the dorsum of the left foot that appears to be infected.  It is not amenable to drainage at this time or suture closure.  Tetanus was updated within the past year. ? ?X-ray left rib series on my review shows a displaced left ninth through 11th rib fractures but no pneumothorax or effusion.  I also reviewed radiology interpretation. ? ?X-ray of the left hip my interpretation without fracture or dislocation.  I reviewed radiology interpretation and agree with the findings of same. ? ?X-ray of the left foot on my review shows a nondisplaced fifth middle lines of fracture.  I also reviewed radiology's interpretation. ? ?X-ray of the left tib-fib and my review without fracture dislocation.  I also viewed radiology's interpretation. ? ?CT abdomen pelvis on my review shows rib fractures seen  on x-ray without any pneumothorax intra-abdominal hematoma hemorrhage or bowel injury or any other acute visceral trauma.  No other acute thoracic abdominal pelvic process.  I also reviewed radiology's interpretation and agree with the findings of possible gastritis versus underdistention of stomach without any other acute process. ? ?BMP shows a glucose of 217 without any other significant electrolyte or metabolic derangements.  CBC without leukocytosis or acute anemia ? ?  Impression is left-sided multiple rib fractures as described in CT as well as fairly significant contusions throughout the left lower extremity as described and concern for possible open fracture in the left fifth digit toe.  There is no purulence.  I do not think he is septic.  I have low suspicion for any other immediate life-threatening process.  Patient given incentive spirometer as well as analgesia and reassessment he is feeling much better.  Heart rate and blood pressure improved.  Blood pressure still elevated at 147.  Discussed with patient above findings and injury concerns.  Advised him to follow-up with his PCP to have his blood sugar and blood pressure rechecked and for any persistent left rib pain.  Advised him that he experiences any new pains or worsening rest pain symptoms return immediately to the emergency room.  Discussed at length concern for possible open fracture left foot.  This was cleaned emergency room.  He was given a dose of Vanco undergo initial work-up.  Will cover with a course of Augmentin and have him see podiatry.  Unfortunately EHR system is not allowing electronic prescribing of controlled substances at this time.  Patient prescribed a short course of Percocet for breakthrough analgesia with a paper prescription.  He has no other acute concerns at this time.  Discharged in stable condition.  Strict return precautions advised and discussed. ? ? ? ?FINAL CLINICAL IMPRESSION(S) / ED DIAGNOSES  ? ?Final diagnoses:   ?Fall, initial encounter  ?Closed fracture of multiple ribs of left side, initial encounter  ?Multiple contusions  ?Hypertension, unspecified type  ?Cellulitis of toe of left foot  ?Hyperglycemia  ?Open n

## 2021-09-11 NOTE — ED Notes (Signed)
Pt to ED for fall that was on Sunday, pt denies LOC and hitting his head, states his ribs hurts and pain radiates down L arm. Has SOB with exertion  ?

## 2021-09-11 NOTE — Progress Notes (Signed)
PHARMACY -  BRIEF ANTIBIOTIC NOTE  ? ?Pharmacy has received consult(s) for Vancomycin from an ED provider.  The patient's profile has been reviewed for ht/wt/allergies/indication/available labs.   ? ?One time order(s) placed for Vancomycin 2 gm per pt wt: 86.2 kg ? ?Further antibiotics/pharmacy consults should be ordered by admitting physician if indicated.       ?                ?Thank you, ?Renda Rolls, PharmD, MBA ?09/11/2021 ?11:18 PM ? ?

## 2021-10-31 DIAGNOSIS — W57XXXA Bitten or stung by nonvenomous insect and other nonvenomous arthropods, initial encounter: Secondary | ICD-10-CM | POA: Diagnosis not present

## 2021-10-31 DIAGNOSIS — S80262A Insect bite (nonvenomous), left knee, initial encounter: Secondary | ICD-10-CM | POA: Diagnosis not present

## 2021-12-28 ENCOUNTER — Other Ambulatory Visit: Payer: Self-pay | Admitting: Internal Medicine

## 2022-01-04 ENCOUNTER — Encounter: Payer: Self-pay | Admitting: Nurse Practitioner

## 2022-01-04 ENCOUNTER — Ambulatory Visit (INDEPENDENT_AMBULATORY_CARE_PROVIDER_SITE_OTHER): Payer: BC Managed Care – PPO | Admitting: Nurse Practitioner

## 2022-01-04 VITALS — BP 172/91 | HR 105 | Ht 67.0 in | Wt 185.9 lb

## 2022-01-04 DIAGNOSIS — I10 Essential (primary) hypertension: Secondary | ICD-10-CM

## 2022-01-04 DIAGNOSIS — Z794 Long term (current) use of insulin: Secondary | ICD-10-CM

## 2022-01-04 DIAGNOSIS — Z1159 Encounter for screening for other viral diseases: Secondary | ICD-10-CM | POA: Diagnosis not present

## 2022-01-04 DIAGNOSIS — F17200 Nicotine dependence, unspecified, uncomplicated: Secondary | ICD-10-CM | POA: Diagnosis not present

## 2022-01-04 DIAGNOSIS — Z114 Encounter for screening for human immunodeficiency virus [HIV]: Secondary | ICD-10-CM

## 2022-01-04 DIAGNOSIS — E1169 Type 2 diabetes mellitus with other specified complication: Secondary | ICD-10-CM

## 2022-01-04 LAB — GLUCOSE, POCT (MANUAL RESULT ENTRY): POC Glucose: 220 mg/dl — AB (ref 70–99)

## 2022-01-04 MED ORDER — DOXAZOSIN MESYLATE 4 MG PO TABS: 4.0000 mg | ORAL_TABLET | Freq: Every day | ORAL | 3 refills | Status: DC

## 2022-01-04 MED ORDER — HYDROCHLOROTHIAZIDE 25 MG PO TABS
25.0000 mg | ORAL_TABLET | Freq: Every day | ORAL | 3 refills | Status: DC
Start: 2022-01-04 — End: 2023-10-02

## 2022-01-04 MED ORDER — SIMVASTATIN 20 MG PO TABS: 20.0000 mg | ORAL_TABLET | Freq: Every day | ORAL | 3 refills | Status: DC

## 2022-01-04 MED ORDER — METFORMIN HCL ER 500 MG PO TB24: 500.0000 mg | ORAL_TABLET | Freq: Two times a day (BID) | ORAL | 3 refills | Status: DC

## 2022-01-04 MED ORDER — DILTIAZEM HCL ER COATED BEADS 240 MG PO CP24
ORAL_CAPSULE | ORAL | 3 refills | Status: DC
Start: 2022-01-04 — End: 2022-10-15

## 2022-01-04 MED ORDER — PIOGLITAZONE HCL 30 MG PO TABS: 30.0000 mg | ORAL_TABLET | Freq: Every day | ORAL | 3 refills | Status: DC

## 2022-01-04 NOTE — Assessment & Plan Note (Addendum)
The blood pressure elevated in the office. Advised patient to consume low-salt and heart healthy diet. Continue diltiazem, hydrochlorothiazide. We will continue to monitor. Advised patient to have nurse visit in a week for blood pressure check. Labs ordered.

## 2022-01-04 NOTE — Assessment & Plan Note (Signed)
Smoking cessation was discussed, 5-7 minutes was spent of this topic specifically.   

## 2022-01-04 NOTE — Progress Notes (Signed)
Established Patient Office Visit  Subjective:  Patient ID: Mark Hendrix, male    DOB: 02/11/1961  Age: 61 y.o. MRN: [id number]  CC:  Chief Complaint  Patient presents with   Diabetes   Hypertension     HPI  Mark Hendrix presents for routine follow-up.He has states that he was out of BP medication from 2 days. He had not taken insulin in a year.  He is overall doing well. No new complaints at present. He work 6 days a week and 10 hours a day at [employer].    HPI   Past Medical History:  Diagnosis Date   Arthritis    Diabetes mellitus without complication ([place])    Hyperlipidemia    Hypertension     Past Surgical History:  Procedure Laterality Date   COLONOSCOPY WITH PROPOFOL N/A 07/12/2016   Procedure: COLONOSCOPY WITH PROPOFOL;  Surgeon: Mark Sails, MD;  Location: [facility] ENDOSCOPY;  Service: Endoscopy;  Laterality: N/A;    Family History  Problem Relation Age of Onset   Diabetes Mother     Social History   Socioeconomic History   Marital status: Married    Spouse name: Not on file   Number of children: Not on file   Years of education: Not on file   Highest education level: Not on file  Occupational History   Not on file  Tobacco Use   Smoking status: Every Day    Packs/day: 1.00    Years: 40.00    Total pack years: 40.00    Types: Cigarettes   Smokeless tobacco: Never  Substance and Sexual Activity   Alcohol use: Yes    Alcohol/week: 6.0 standard drinks of alcohol    Types: 6 Cans of beer per week    Comment: daily   Drug use: No   Sexual activity: Not on file  Other Topics Concern   Not on file  Social History Narrative   Not on file   Social Determinants of Health   Financial Resource Strain: Not on file  Food Insecurity: Not on file  Transportation Needs: Not on file  Physical Activity: Not on file  Stress: Not on file  Social Connections: Not on file  Intimate Partner Violence: Not on file     Outpatient  Medications Prior to Visit  Medication Sig Dispense Refill   ibuprofen (ADVIL,MOTRIN) 800 MG tablet Take 800 mg by mouth every 8 (eight) hours as needed for pain.     omeprazole (PRILOSEC) 40 MG capsule TAKE 1 CAPSULE BY MOUTH ONCE DAILY 90 capsule 3   diltiazem (CARDIZEM CD) 240 MG 24 hr capsule TAKE 1 CAPSULE BY MOUTH EVERY DAY 90 capsule 3   doxazosin (CARDURA) 4 MG tablet TAKE 1 TABLET BY MOUTH ONCE DAILY 30 tablet 3   hydrochlorothiazide (HYDRODIURIL) 25 MG tablet TAKE 1 TABLET BY MOUTH DAILY 30 tablet 6   metFORMIN (GLUCOPHAGE-XR) 500 MG 24 hr tablet TAKE TWO TABLETS TWICE A DAY WITH MEALS 60 tablet 5   pioglitazone (ACTOS) 30 MG tablet TAKE 1 TABLET BY MOUTH DAILY 30 tablet 6   simvastatin (ZOCOR) 20 MG tablet TAKE 1 TABLET BY MOUTH DAILY 30 tablet 6   Insulin Glargine (BASAGLAR KWIKPEN) 100 UNIT/ML INJECT 35 UNITS MAX DOSE EVERY DAY (Patient not taking: Reported on 01/04/2022) 45 mL 6   augmented betamethasone dipropionate (DIPROLENE-AF) 0.05 % ointment APPLY ONCE DAILY AFTER SHOWER AS DIRECTED 45 g 4   diltiazem (DILACOR XR) 180 MG 24 hr  capsule Take 180 mg by mouth 2 (two) times daily.     No facility-administered medications prior to visit.    Allergies  Allergen Reactions   Ace Inhibitors Swelling    ROS Review of Systems  Constitutional: Negative.   HENT: Negative.    Eyes: Negative.   Respiratory: Negative.    Cardiovascular: Negative.   Gastrointestinal: Negative.   Genitourinary: Negative.   Neurological: Negative.  Negative for dizziness, facial asymmetry and headaches.  Psychiatric/Behavioral: Negative.  Negative for agitation and behavioral problems.       Objective:    Physical Exam Constitutional:      Appearance: Normal appearance. He is overweight.  HENT:     Right Ear: Tympanic membrane normal.     Left Ear: Tympanic membrane normal.     Nose: Nose normal.     Mouth/Throat:     Mouth: Mucous membranes are moist.     Pharynx: Oropharynx is clear.   Eyes:     Conjunctiva/sclera: Conjunctivae normal.     Pupils: Pupils are equal, round, and reactive to light.  Cardiovascular:     Rate and Rhythm: Normal rate and regular rhythm.     Heart sounds: No murmur heard.    No friction rub.  Pulmonary:     Effort: Pulmonary effort is normal.     Breath sounds: No stridor. No wheezing.  Abdominal:     General: Bowel sounds are normal.     Palpations: Abdomen is soft. There is no mass.     Tenderness: There is no abdominal tenderness.  Musculoskeletal:        General: Normal range of motion.     Cervical back: Normal range of motion.  Skin:    General: Skin is warm.     Findings: No bruising.  Neurological:     General: No focal deficit present.     Mental Status: He is alert and oriented to person, place, and time. Mental status is at baseline.  Psychiatric:        Mood and Affect: Mood normal.        Behavior: Behavior normal.        Thought Content: Thought content normal.        Judgment: Judgment normal.     BP (!) 172/91   Pulse (!) 105   Ht '5\' 7"'$  (1.702 m)   Wt 185 lb 14.4 oz (84.3 kg)   BMI 29.12 kg/m  Wt Readings from Last 3 Encounters:  01/04/22 185 lb 14.4 oz (84.3 kg)  09/11/21 190 lb (86.2 kg)  04/22/21 190 lb (86.2 kg)     Health Maintenance  Topic Date Due   HEMOGLOBIN A1C  Never done   OPHTHALMOLOGY EXAM  Never done   Hepatitis C Screening  Never done   INFLUENZA VACCINE  12/11/2021   FOOT EXAM  01/05/2022 (Originally 06/11/1970)   COVID-19 Vaccine (3 - Moderna series) 01/20/2022 (Originally 11/09/2019)   Zoster Vaccines- Shingrix (1 of 2) 04/06/2022 (Originally 06/11/2010)   COLONOSCOPY (Pts 45-45yr Insurance coverage will need to be confirmed)  07/13/2026   TETANUS/TDAP  04/23/2031   HIV Screening  Completed   HPV VACCINES  Aged Out    There are no preventive care reminders to display for this patient.  Lab Results  Component Value Date   TSH 1.480 06/08/2020   Lab Results  Component Value  Date   WBC 8.9 09/11/2021   HGB 13.9 09/11/2021   HCT 41.0 09/11/2021   MCV  88.9 09/11/2021   PLT 282 09/11/2021   Lab Results  Component Value Date   NA 135 09/11/2021   K 3.5 09/11/2021   CO2 24 09/11/2021   GLUCOSE 217 (H) 09/11/2021   BUN 9 09/11/2021   CREATININE 0.68 09/11/2021   BILITOT 0.4 06/08/2020   ALKPHOS 84 06/08/2020   AST 26 06/08/2020   ALT 26 06/08/2020   PROT 7.4 06/08/2020   ALBUMIN 4.6 06/08/2020   CALCIUM 9.0 09/11/2021   ANIONGAP 13 09/11/2021   Lab Results  Component Value Date   CHOL 212 (H) 06/08/2020   Lab Results  Component Value Date   HDL 81 06/08/2020   Lab Results  Component Value Date   LDLCALC 106 (H) 06/08/2020   Lab Results  Component Value Date   TRIG 146 06/08/2020   Lab Results  Component Value Date   CHOLHDL 2.6 06/08/2020   No results found for: "HGBA1C"    Assessment & Plan:   Problem List Items Addressed This Visit       Cardiovascular and Mediastinum   Essential hypertension    The blood pressure elevated in the office. Advised patient to consume low-salt and heart healthy diet. Continue diltiazem, hydrochlorothiazide. We will continue to monitor. Advised patient to have nurse visit in a week for blood pressure check. Labs ordered.      Relevant Medications   diltiazem (CARDIZEM CD) 240 MG 24 hr capsule   doxazosin (CARDURA) 4 MG tablet   hydrochlorothiazide (HYDRODIURIL) 25 MG tablet   simvastatin (ZOCOR) 20 MG tablet   Other Relevant Orders   CBC with Differential/Platelet   COMPLETE METABOLIC PANEL WITH GFR   TSH   PSA   Lipid panel     Endocrine   Type 2 diabetes mellitus ([place])    Patient BS 220 in the office today. Advised pt to check the BS regularly, make a log and bring to next appointment.  Advised pt to monitor diet. Advised pt to eat variety of food including fruits, vegetables, whole grains, complex carbohydrates and proteins.  Hg A1c ordered.      Relevant Medications    metFORMIN (GLUCOPHAGE-XR) 500 MG 24 hr tablet   pioglitazone (ACTOS) 30 MG tablet   simvastatin (ZOCOR) 20 MG tablet   Other Relevant Orders   POCT glucose (manual entry) (Completed)   Hemoglobin A1c   Ambulatory referral to Ophthalmology     Other   Smoker    Smoking cessation was discussed, 5-7 minutes was spent of this topic specifically.        Other Visit Diagnoses     Need for hepatitis C screening test    -  Primary   Relevant Orders   Hepatitis C antibody   Encounter for screening for HIV       Relevant Orders   HIV Antibody (routine testing w rflx)        Meds ordered this encounter  Medications   diltiazem (CARDIZEM CD) 240 MG 24 hr capsule    Sig: TAKE 1 CAPSULE BY MOUTH EVERY DAY    Dispense:  90 capsule    Refill:  3    PT OUT OF REFILLS   doxazosin (CARDURA) 4 MG tablet    Sig: Take 1 tablet (4 mg total) by mouth daily.    Dispense:  90 tablet    Refill:  3    FOR NEXT FILL. THANK YOU   hydrochlorothiazide (HYDRODIURIL) 25 MG tablet    Sig: Take 1 tablet (  25 mg total) by mouth daily.    Dispense:  90 tablet    Refill:  3    FOR FUTURE REFILLS PLEASE   metFORMIN (GLUCOPHAGE-XR) 500 MG 24 hr tablet    Sig: Take 1 tablet (500 mg total) by mouth 2 (two) times daily.    Dispense:  180 tablet    Refill:  3   pioglitazone (ACTOS) 30 MG tablet    Sig: Take 1 tablet (30 mg total) by mouth daily.    Dispense:  90 tablet    Refill:  3    FOR FUTURE REFILLS PLEASE   simvastatin (ZOCOR) 20 MG tablet    Sig: Take 1 tablet (20 mg total) by mouth daily.    Dispense:  90 tablet    Refill:  3    FOR FUTURE REFILLS PLEASE     Follow-up: Return in about 2 months (around 03/06/2022) for routine and 1 week for BP check with nurse.    Theresia Lo, NP

## 2022-01-04 NOTE — Assessment & Plan Note (Signed)
Patient BS 220 in the office today. Advised pt to check the BS regularly, make a log and bring to next appointment.  Advised pt to monitor diet. Advised pt to eat variety of food including fruits, vegetables, whole grains, complex carbohydrates and proteins.  Hg A1c ordered.

## 2022-01-08 LAB — COMPLETE METABOLIC PANEL WITH GFR
AG Ratio: 1.4 (calc) (ref 1.0–2.5)
ALT: 15 U/L (ref 9–46)
AST: 19 U/L (ref 10–35)
Albumin: 4.2 g/dL (ref 3.6–5.1)
Alkaline phosphatase (APISO): 86 U/L (ref 35–144)
BUN: 17 mg/dL (ref 7–25)
CO2: 20 mmol/L (ref 20–32)
Calcium: 9.4 mg/dL (ref 8.6–10.3)
Chloride: 100 mmol/L (ref 98–110)
Creat: 0.93 mg/dL (ref 0.70–1.35)
Globulin: 3 g/dL (calc) (ref 1.9–3.7)
Glucose, Bld: 261 mg/dL — ABNORMAL HIGH (ref 65–99)
Potassium: 3.6 mmol/L (ref 3.5–5.3)
Sodium: 138 mmol/L (ref 135–146)
Total Bilirubin: 0.5 mg/dL (ref 0.2–1.2)
Total Protein: 7.2 g/dL (ref 6.1–8.1)
eGFR: 93 mL/min/{1.73_m2} (ref >=60)

## 2022-01-08 LAB — HEMOGLOBIN A1C
Hgb A1c MFr Bld: 10 % of total Hgb — ABNORMAL HIGH (ref <5.7)
Mean Plasma Glucose: 240 mg/dL
eAG (mmol/L): 13.3 mmol/L

## 2022-01-08 LAB — CBC WITH DIFFERENTIAL/PLATELET
Absolute Monocytes: 600 cells/uL (ref 200–950)
Basophils Absolute: 80 cells/uL (ref 0–200)
Basophils Relative: 1 %
Eosinophils Absolute: 96 cells/uL (ref 15–500)
Eosinophils Relative: 1.2 %
HCT: 40.5 % (ref 38.5–50.0)
Hemoglobin: 13.7 g/dL (ref 13.2–17.1)
Lymphs Abs: 2184 cells/uL (ref 850–3900)
MCH: 31.1 pg (ref 27.0–33.0)
MCHC: 33.8 g/dL (ref 32.0–36.0)
MCV: 92 fL (ref 80.0–100.0)
MPV: 10.3 fL (ref 7.5–12.5)
Monocytes Relative: 7.5 %
Neutro Abs: 5040 cells/uL (ref 1500–7800)
Neutrophils Relative %: 63 %
Platelets: 304 10*3/uL (ref 140–400)
RBC: 4.4 10*6/uL (ref 4.20–5.80)
RDW: 12.3 % (ref 11.0–15.0)
Total Lymphocyte: 27.3 %
WBC: 8 10*3/uL (ref 3.8–10.8)

## 2022-01-08 LAB — LIPID PANEL
Cholesterol: 230 mg/dL — ABNORMAL HIGH (ref <200)
HDL: 75 mg/dL (ref >=40)
LDL Cholesterol (Calc): 126 mg/dL (calc) — ABNORMAL HIGH
Non-HDL Cholesterol (Calc): 155 mg/dL (calc) — ABNORMAL HIGH (ref <130)
Total CHOL/HDL Ratio: 3.1 (calc) (ref <5.0)
Triglycerides: 175 mg/dL — ABNORMAL HIGH (ref <150)

## 2022-01-08 LAB — HEPATITIS C ANTIBODY: Hepatitis C Ab: NONREACTIVE

## 2022-01-08 LAB — TSH: TSH: 1.19 mIU/L (ref 0.40–4.50)

## 2022-01-08 LAB — HIV ANTIBODY (ROUTINE TESTING W REFLEX): HIV 1&2 Ab, 4th Generation: NONREACTIVE

## 2022-01-08 LAB — PSA: PSA: 0.28 ng/mL (ref <=4.00)

## 2022-01-17 NOTE — Progress Notes (Signed)
Please call pt with the abnormal result and make a follow up appointment.

## 2022-01-24 ENCOUNTER — Encounter: Payer: Self-pay | Admitting: Nurse Practitioner

## 2022-01-24 ENCOUNTER — Ambulatory Visit (INDEPENDENT_AMBULATORY_CARE_PROVIDER_SITE_OTHER): Payer: BC Managed Care – PPO | Admitting: Nurse Practitioner

## 2022-01-24 VITALS — BP 130/72 | HR 80 | Ht 67.0 in | Wt 185.9 lb

## 2022-01-24 DIAGNOSIS — Z716 Tobacco abuse counseling: Secondary | ICD-10-CM

## 2022-01-24 DIAGNOSIS — E7849 Other hyperlipidemia: Secondary | ICD-10-CM

## 2022-01-24 DIAGNOSIS — E1169 Type 2 diabetes mellitus with other specified complication: Secondary | ICD-10-CM | POA: Diagnosis not present

## 2022-01-24 DIAGNOSIS — Z794 Long term (current) use of insulin: Secondary | ICD-10-CM | POA: Diagnosis not present

## 2022-01-24 LAB — GLUCOSE, POCT (MANUAL RESULT ENTRY): POC Glucose: 280 mg/dl — AB (ref 70–99)

## 2022-01-24 MED ORDER — SITAGLIPTIN PHOSPHATE 50 MG PO TABS: 50.0000 mg | ORAL_TABLET | Freq: Every day | ORAL | 1 refills | Status: DC

## 2022-01-24 NOTE — Progress Notes (Unsigned)
Established Patient Office Visit  Subjective:  Patient ID: Mark Hendrix, male    DOB: 1961-02-24  Age: 61 y.o. MRN: [id number]  CC:  Chief Complaint  Patient presents with   Diabetes    Follow up lab results      HPI  Mark Hendrix presents for:  HPI   Past Medical History:  Diagnosis Date   Arthritis    Diabetes mellitus without complication ([place])    Hyperlipidemia    Hypertension     Past Surgical History:  Procedure Laterality Date   COLONOSCOPY WITH PROPOFOL N/A 07/12/2016   Procedure: COLONOSCOPY WITH PROPOFOL;  Surgeon: Lollie Sails, MD;  Location: [facility] ENDOSCOPY;  Service: Endoscopy;  Laterality: N/A;    Family History  Problem Relation Age of Onset   Diabetes Mother     Social History   Socioeconomic History   Marital status: Married    Spouse name: Not on file   Number of children: Not on file   Years of education: Not on file   Highest education level: Not on file  Occupational History   Not on file  Tobacco Use   Smoking status: Every Day    Packs/day: 1.00    Years: 40.00    Total pack years: 40.00    Types: Cigarettes   Smokeless tobacco: Never  Substance and Sexual Activity   Alcohol use: Yes    Alcohol/week: 6.0 standard drinks of alcohol    Types: 6 Cans of beer per week    Comment: daily   Drug use: No   Sexual activity: Not on file  Other Topics Concern   Not on file  Social History Narrative   Not on file   Social Determinants of Health   Financial Resource Strain: Not on file  Food Insecurity: Not on file  Transportation Needs: Not on file  Physical Activity: Not on file  Stress: Not on file  Social Connections: Not on file  Intimate Partner Violence: Not on file     Outpatient Medications Prior to Visit  Medication Sig Dispense Refill   diltiazem (CARDIZEM CD) 240 MG 24 hr capsule TAKE 1 CAPSULE BY MOUTH EVERY DAY 90 capsule 3   doxazosin (CARDURA) 4 MG tablet Take 1 tablet (4 mg total) by mouth daily.  90 tablet 3   hydrochlorothiazide (HYDRODIURIL) 25 MG tablet Take 1 tablet (25 mg total) by mouth daily. 90 tablet 3   ibuprofen (ADVIL,MOTRIN) 800 MG tablet Take 800 mg by mouth every 8 (eight) hours as needed for pain.     metFORMIN (GLUCOPHAGE-XR) 500 MG 24 hr tablet Take 1 tablet (500 mg total) by mouth 2 (two) times daily. 180 tablet 3   omeprazole (PRILOSEC) 40 MG capsule TAKE 1 CAPSULE BY MOUTH ONCE DAILY 90 capsule 3   pioglitazone (ACTOS) 30 MG tablet Take 1 tablet (30 mg total) by mouth daily. 90 tablet 3   simvastatin (ZOCOR) 20 MG tablet Take 1 tablet (20 mg total) by mouth daily. 90 tablet 3   Insulin Glargine (BASAGLAR KWIKPEN) 100 UNIT/ML INJECT 35 UNITS MAX DOSE EVERY DAY (Patient not taking: Reported on 01/04/2022) 45 mL 6   No facility-administered medications prior to visit.    Allergies  Allergen Reactions   Ace Inhibitors Swelling    ROS Review of Systems  Constitutional: Negative.   HENT: Negative.    Eyes: Negative.   Respiratory:  Negative for chest tightness and shortness of breath.   Cardiovascular:  Negative for  chest pain and palpitations.  Gastrointestinal: Negative.   Genitourinary: Negative.   Musculoskeletal: Negative.   Neurological: Negative.   Psychiatric/Behavioral: Negative.        Objective:    Physical Exam Constitutional:      Appearance: Normal appearance. He is normal weight.  HENT:     Head: Normocephalic and atraumatic.     Right Ear: Tympanic membrane normal.     Left Ear: Tympanic membrane normal.     Nose: Nose normal.     Mouth/Throat:     Mouth: Mucous membranes are moist.  Eyes:     Extraocular Movements: Extraocular movements intact.     Conjunctiva/sclera: Conjunctivae normal.     Pupils: Pupils are equal, round, and reactive to light.  Cardiovascular:     Rate and Rhythm: Normal rate and regular rhythm.     Pulses: Normal pulses.  Pulmonary:     Effort: Pulmonary effort is normal.     Breath sounds: Normal breath  sounds.  Abdominal:     General: Abdomen is flat. Bowel sounds are normal.     Palpations: Abdomen is soft.  Skin:    General: Skin is warm.     Capillary Refill: Capillary refill takes less than 2 seconds.  Neurological:     General: No focal deficit present.     Mental Status: He is alert and oriented to person, place, and time. Mental status is at baseline.  Psychiatric:        Mood and Affect: Mood normal.        Behavior: Behavior normal.        Thought Content: Thought content normal.        Judgment: Judgment normal.     There were no vitals taken for this visit. Wt Readings from Last 3 Encounters:  01/04/22 185 lb 14.4 oz (84.3 kg)  09/11/21 190 lb (86.2 kg)  04/22/21 190 lb (86.2 kg)     Health Maintenance  Topic Date Due   FOOT EXAM  Never done   OPHTHALMOLOGY EXAM  Never done   Diabetic kidney evaluation - Urine ACR  Never done   COVID-19 Vaccine (3 - Moderna series) 11/09/2019   INFLUENZA VACCINE  Never done   Zoster Vaccines- Shingrix (1 of 2) 04/06/2022 (Originally 06/11/2010)   HEMOGLOBIN A1C  07/07/2022   Diabetic kidney evaluation - GFR measurement  01/05/2023   COLONOSCOPY (Pts 45-22yrs Insurance coverage will need to be confirmed)  07/13/2026   TETANUS/TDAP  04/23/2031   Hepatitis C Screening  Completed   HIV Screening  Completed   HPV VACCINES  Aged Out    There are no preventive care reminders to display for this patient.  Lab Results  Component Value Date   TSH 1.19 01/04/2022   Lab Results  Component Value Date   WBC 8.0 01/04/2022   HGB 13.7 01/04/2022   HCT 40.5 01/04/2022   MCV 92.0 01/04/2022   PLT 304 01/04/2022   Lab Results  Component Value Date   NA 138 01/04/2022   K 3.6 01/04/2022   CO2 20 01/04/2022   GLUCOSE 261 (H) 01/04/2022   BUN 17 01/04/2022   CREATININE 0.93 01/04/2022   BILITOT 0.5 01/04/2022   ALKPHOS 84 06/08/2020   AST 19 01/04/2022   ALT 15 01/04/2022   PROT 7.2 01/04/2022   ALBUMIN 4.6 06/08/2020    CALCIUM 9.4 01/04/2022   ANIONGAP 13 09/11/2021   EGFR 93 01/04/2022   Lab Results  Component Value Date  CHOL 230 (H) 01/04/2022   Lab Results  Component Value Date   HDL 75 01/04/2022   Lab Results  Component Value Date   LDLCALC 126 (H) 01/04/2022   Lab Results  Component Value Date   TRIG 175 (H) 01/04/2022   Lab Results  Component Value Date   CHOLHDL 3.1 01/04/2022   Lab Results  Component Value Date   HGBA1C 10.0 (H) 01/04/2022      Assessment & Plan:   Problem List Items Addressed This Visit   None    No orders of the defined types were placed in this encounter.    Follow-up: No follow-ups on file.    Theresia Lo, NP

## 2022-01-30 NOTE — Assessment & Plan Note (Signed)
Patient has elevated cholesterol, LDL and triglycerides. Advised patient to eat healthy and avoid fatty and fried food. Follow regular exercise routine. Continue simvastatin 20 mg daily.

## 2022-01-30 NOTE — Assessment & Plan Note (Signed)
Advised patient to quit smoking. Smoking cessation was discussed, 5-7 minutes was spent of this topic specifically.

## 2022-01-30 NOTE — Assessment & Plan Note (Addendum)
His hemoglobin A1c 10 on 01/04/2022. Advised pt to check the BS regularly, make a log and bring to next appointment.  Advised pt to monitor diet. Advised pt to eat variety of food including fruits, vegetables, whole grains, complex carbohydrates and proteins.  Started him on Januvia and continue metformin and Actos. Patient states he is not taking insulin. Handout provided for diet.

## 2022-02-08 ENCOUNTER — Encounter: Payer: Self-pay | Admitting: Nurse Practitioner

## 2022-02-08 ENCOUNTER — Ambulatory Visit (INDEPENDENT_AMBULATORY_CARE_PROVIDER_SITE_OTHER): Payer: BC Managed Care – PPO | Admitting: Nurse Practitioner

## 2022-02-08 VITALS — BP 150/86 | HR 84 | Ht 67.0 in | Wt 186.8 lb

## 2022-02-08 DIAGNOSIS — I10 Essential (primary) hypertension: Secondary | ICD-10-CM

## 2022-02-08 DIAGNOSIS — Z23 Encounter for immunization: Secondary | ICD-10-CM | POA: Diagnosis not present

## 2022-02-08 DIAGNOSIS — E1169 Type 2 diabetes mellitus with other specified complication: Secondary | ICD-10-CM | POA: Diagnosis not present

## 2022-02-08 DIAGNOSIS — Z794 Long term (current) use of insulin: Secondary | ICD-10-CM

## 2022-02-08 LAB — GLUCOSE, POCT (MANUAL RESULT ENTRY): POC Glucose: 295 mg/dl — AB (ref 70–99)

## 2022-02-08 MED ORDER — BLOOD GLUCOSE METER KIT: PACK | 6 refills | Status: AC

## 2022-02-09 ENCOUNTER — Encounter: Payer: Self-pay | Admitting: Nurse Practitioner

## 2022-02-09 DIAGNOSIS — Z23 Encounter for immunization: Secondary | ICD-10-CM | POA: Insufficient documentation

## 2022-02-09 LAB — MICROALBUMIN / CREATININE URINE RATIO
Creatinine, Urine: 94 mg/dL (ref 20–320)
Microalb Creat Ratio: 189 mcg/mg creat — ABNORMAL HIGH (ref <30)
Microalb, Ur: 17.8 mg/dL

## 2022-02-09 NOTE — Addendum Note (Signed)
Addended by: Theresia Lo on: 02/09/2022 12:34 AM   Modules accepted: Level of Service

## 2022-02-09 NOTE — Assessment & Plan Note (Addendum)
IM flu vaccine administered by MA.

## 2022-02-09 NOTE — Assessment & Plan Note (Signed)
His blood sugar 295 at the office today. Encourage patient to consume low carbs diet and follow regular exercise routine. Encourage patient to check blood sugar at home and bring with him to next appointment. Continue metformin and Januvia.

## 2022-02-09 NOTE — Progress Notes (Signed)
Established Patient Office Visit  Subjective:  Patient ID: Mark Hendrix, male    DOB: 07-11-1960  Age: 61 y.o. MRN: [id number]  CC:  Chief Complaint  Patient presents with   Diabetes     HPI  KENNEITH Hendrix presents for diabetes follow-up.  Patient states that his blood sugar machine does not work and would like to get a new one.  Patient states that he is trying to eat healthy. He also states that he consume [employer] biscuit for breakfast daily.  HPI   Past Medical History:  Diagnosis Date   Arthritis    Diabetes mellitus without complication ([place])    Hyperlipidemia    Hypertension     Past Surgical History:  Procedure Laterality Date   COLONOSCOPY WITH PROPOFOL N/A 07/12/2016   Procedure: COLONOSCOPY WITH PROPOFOL;  Surgeon: Lollie Sails, MD;  Location: [facility] ENDOSCOPY;  Service: Endoscopy;  Laterality: N/A;    Family History  Problem Relation Age of Onset   Diabetes Mother     Social History   Socioeconomic History   Marital status: Married    Spouse name: Not on file   Number of children: Not on file   Years of education: Not on file   Highest education level: Not on file  Occupational History   Not on file  Tobacco Use   Smoking status: Every Day    Packs/day: 1.00    Years: 40.00    Total pack years: 40.00    Types: Cigarettes   Smokeless tobacco: Never  Substance and Sexual Activity   Alcohol use: Yes    Alcohol/week: 6.0 standard drinks of alcohol    Types: 6 Cans of beer per week    Comment: daily   Drug use: No   Sexual activity: Not on file  Other Topics Concern   Not on file  Social History Narrative   Not on file   Social Determinants of Health   Financial Resource Strain: Not on file  Food Insecurity: Not on file  Transportation Needs: Not on file  Physical Activity: Not on file  Stress: Not on file  Social Connections: Not on file  Intimate Partner Violence: Not on file     Outpatient Medications Prior to Visit   Medication Sig Dispense Refill   diltiazem (CARDIZEM CD) 240 MG 24 hr capsule TAKE 1 CAPSULE BY MOUTH EVERY DAY 90 capsule 3   doxazosin (CARDURA) 4 MG tablet Take 1 tablet (4 mg total) by mouth daily. 90 tablet 3   hydrochlorothiazide (HYDRODIURIL) 25 MG tablet Take 1 tablet (25 mg total) by mouth daily. 90 tablet 3   ibuprofen (ADVIL,MOTRIN) 800 MG tablet Take 800 mg by mouth every 8 (eight) hours as needed for pain.     metFORMIN (GLUCOPHAGE-XR) 500 MG 24 hr tablet Take 1 tablet (500 mg total) by mouth 2 (two) times daily. 180 tablet 3   omeprazole (PRILOSEC) 40 MG capsule TAKE 1 CAPSULE BY MOUTH ONCE DAILY 90 capsule 3   pioglitazone (ACTOS) 30 MG tablet Take 1 tablet (30 mg total) by mouth daily. 90 tablet 3   simvastatin (ZOCOR) 20 MG tablet Take 1 tablet (20 mg total) by mouth daily. 90 tablet 3   sitaGLIPtin (JANUVIA) 50 MG tablet Take 1 tablet (50 mg total) by mouth daily. 30 tablet 1   No facility-administered medications prior to visit.    Allergies  Allergen Reactions   Ace Inhibitors Swelling    ROS Review of Systems  Constitutional: Negative.   HENT: Negative.    Eyes: Negative.   Respiratory:  Negative for chest tightness and shortness of breath.   Cardiovascular:  Negative for palpitations.  Gastrointestinal: Negative.   Genitourinary: Negative.   Musculoskeletal: Negative.   Neurological: Negative.   Psychiatric/Behavioral: Negative.        Objective:    Physical Exam Constitutional:      Appearance: Normal appearance. He is normal weight.  HENT:     Head: Normocephalic and atraumatic.     Right Ear: Tympanic membrane normal.     Left Ear: Tympanic membrane normal.     Nose: Nose normal.     Mouth/Throat:     Mouth: Mucous membranes are moist.  Eyes:     Extraocular Movements: Extraocular movements intact.     Conjunctiva/sclera: Conjunctivae normal.     Pupils: Pupils are equal, round, and reactive to light.  Cardiovascular:     Rate and Rhythm:  Normal rate and regular rhythm.     Pulses: Normal pulses.  Pulmonary:     Effort: Pulmonary effort is normal.     Breath sounds: Normal breath sounds.  Abdominal:     General: Abdomen is flat. Bowel sounds are normal.     Palpations: Abdomen is soft.  Skin:    General: Skin is warm.     Capillary Refill: Capillary refill takes less than 2 seconds.  Neurological:     General: No focal deficit present.     Mental Status: He is alert and oriented to person, place, and time. Mental status is at baseline.  Psychiatric:        Mood and Affect: Mood normal.        Behavior: Behavior normal.        Thought Content: Thought content normal.        Judgment: Judgment normal.     BP (!) 150/86   Pulse 84   Ht 5' 7" (1.702 m)   Wt 186 lb 12.8 oz (84.7 kg)   BMI 29.26 kg/m  Wt Readings from Last 3 Encounters:  02/08/22 186 lb 12.8 oz (84.7 kg)  01/24/22 185 lb 14.4 oz (84.3 kg)  01/04/22 185 lb 14.4 oz (84.3 kg)     Health Maintenance  Topic Date Due   OPHTHALMOLOGY EXAM  Never done   Diabetic kidney evaluation - Urine ACR  Never done   COVID-19 Vaccine (3 - Moderna series) 11/09/2019   Zoster Vaccines- Shingrix (1 of 2) 04/06/2022 (Originally 06/11/2010)   HEMOGLOBIN A1C  07/07/2022   Diabetic kidney evaluation - GFR measurement  01/05/2023   FOOT EXAM  02/09/2023   COLONOSCOPY (Pts 45-57yr Insurance coverage will need to be confirmed)  07/13/2026   TETANUS/TDAP  04/23/2031   INFLUENZA VACCINE  Completed   Hepatitis C Screening  Completed   HIV Screening  Completed   HPV VACCINES  Aged Out    There are no preventive care reminders to display for this patient.  Lab Results  Component Value Date   TSH 1.19 01/04/2022   Lab Results  Component Value Date   WBC 8.0 01/04/2022   HGB 13.7 01/04/2022   HCT 40.5 01/04/2022   MCV 92.0 01/04/2022   PLT 304 01/04/2022   Lab Results  Component Value Date   NA 138 01/04/2022   K 3.6 01/04/2022   CO2 20 01/04/2022    GLUCOSE 261 (H) 01/04/2022   BUN 17 01/04/2022   CREATININE 0.93 01/04/2022   BILITOT 0.5  01/04/2022   ALKPHOS 84 06/08/2020   AST 19 01/04/2022   ALT 15 01/04/2022   PROT 7.2 01/04/2022   ALBUMIN 4.6 06/08/2020   CALCIUM 9.4 01/04/2022   ANIONGAP 13 09/11/2021   EGFR 93 01/04/2022   Lab Results  Component Value Date   CHOL 230 (H) 01/04/2022   Lab Results  Component Value Date   HDL 75 01/04/2022   Lab Results  Component Value Date   LDLCALC 126 (H) 01/04/2022   Lab Results  Component Value Date   TRIG 175 (H) 01/04/2022   Lab Results  Component Value Date   CHOLHDL 3.1 01/04/2022   Lab Results  Component Value Date   HGBA1C 10.0 (H) 01/04/2022      Assessment & Plan:   Problem List Items Addressed This Visit       Cardiovascular and Mediastinum   Essential hypertension    Patient blood pressure elevated in the office today. Discussed the pt medication regimen and verified that they are taking the medication appropriately.  Advise pt to limit the intake of sodium as he was consuming [employer] biscuit for breakfast every day. Education provided on diet management Advise pt to increase the intake of water, perform regular exercise and  cut down smoking.  Advise pt to measure the BP at home and bring it to the next appointment.  Would consider medication adjustment if the BP remains elevated. Advised patient to have 2 weeks nurse visit for blood pressure check..           Endocrine   Type 2 diabetes mellitus ([place]) - Primary    His blood sugar 295 at the office today. Encourage patient to consume low carbs diet and follow regular exercise routine. Encourage patient to check blood sugar at home and bring with him to next appointment. Continue metformin and Januvia.      Relevant Orders   POCT glucose (manual entry) (Completed)   Urine microalbumin-creatinine with uACR     Other   Need for influenza vaccination    IM flu vaccine administered by MA.       Relevant Orders   Flu Vaccine QUAD 6+ mos PF IM (Fluarix Quad PF) (Completed)     Meds ordered this encounter  Medications   blood glucose meter kit and supplies    Sig: Dispense based on patient and insurance preference. Use up to four times daily as directed. (FOR ICD-10 E10.9, E11.9).    Dispense:  1 each    Refill:  6    Order Specific Question:   Number of strips    Answer:   100    Order Specific Question:   Number of lancets    Answer:   100     Follow-up: Return for 2 weeks for nurse visit on blood pressure check and 1 month for diabetes and blood pressure.    Theresia Lo, NP

## 2022-02-09 NOTE — Assessment & Plan Note (Addendum)
Patient blood pressure elevated in the office today. Discussed the pt medication regimen and verified that they are taking the medication appropriately.  Advise pt to limit the intake of sodium as he was consuming [employer] biscuit for breakfast every day. Education provided on diet management Advise pt to increase the intake of water, perform regular exercise and  cut down smoking.  Advise pt to measure the BP at home and bring it to the next appointment.  Would consider medication adjustment if the BP remains elevated. Advised patient to have 2 weeks nurse visit for blood pressure check.Mark Hendrix

## 2022-04-08 ENCOUNTER — Telehealth: Payer: Self-pay

## 2022-04-08 NOTE — Patient Outreach (Signed)
  Care Coordination   04/08/2022 Name: Mark Hendrix MRN: [id number] DOB: Aug 12, 1960   Care Coordination Outreach Attempts:  An unsuccessful telephone outreach was attempted today to offer the patient information about available care coordination services as a benefit of their health plan.  Contact  states patient is not at home.  HIPAA compliant message left with call back phone number.  Follow Up Plan:  Additional outreach attempts will be made to offer the patient care coordination information and services.   Encounter Outcome:  Message left   Care Coordination Interventions:  No, not indicated    Quinn Plowman [facility] [place] [phone] direct line

## 2022-04-13 ENCOUNTER — Other Ambulatory Visit: Payer: Self-pay | Admitting: Internal Medicine

## 2022-10-14 ENCOUNTER — Other Ambulatory Visit: Payer: Self-pay | Admitting: Nurse Practitioner

## 2022-10-14 DIAGNOSIS — I10 Essential (primary) hypertension: Secondary | ICD-10-CM

## 2022-10-16 NOTE — Telephone Encounter (Signed)
LVM to schedule in office visit to continue to get refills

## 2022-10-17 NOTE — Telephone Encounter (Signed)
VM was full unable to leave message

## 2022-10-21 NOTE — Telephone Encounter (Signed)
Was not able to leave vm because it was full

## 2022-10-28 NOTE — Telephone Encounter (Signed)
Unable to lvm mailbox is full

## 2022-11-19 ENCOUNTER — Emergency Department: Admission: EM | Admit: 2022-11-19 | Payer: BC Managed Care – PPO | Source: Home / Self Care

## 2022-11-19 ENCOUNTER — Emergency Department: Payer: BC Managed Care – PPO

## 2022-11-19 DIAGNOSIS — R109 Unspecified abdominal pain: Secondary | ICD-10-CM

## 2022-11-19 DIAGNOSIS — R1030 Lower abdominal pain, unspecified: Secondary | ICD-10-CM | POA: Diagnosis not present

## 2022-11-19 DIAGNOSIS — E119 Type 2 diabetes mellitus without complications: Secondary | ICD-10-CM | POA: Insufficient documentation

## 2022-11-19 DIAGNOSIS — I10 Essential (primary) hypertension: Secondary | ICD-10-CM | POA: Diagnosis not present

## 2022-11-19 LAB — COMPREHENSIVE METABOLIC PANEL
ALT: 19 U/L (ref 0–44)
AST: 21 U/L (ref 15–41)
Albumin: 4.4 g/dL (ref 3.5–5.0)
Alkaline Phosphatase: 63 U/L (ref 38–126)
Anion gap: 15 (ref 5–15)
BUN: 20 mg/dL (ref 8–23)
CO2: 21 mmol/L — ABNORMAL LOW (ref 22–32)
Calcium: 9 mg/dL (ref 8.9–10.3)
Chloride: 98 mmol/L (ref 98–111)
Creatinine, Ser: 1.36 mg/dL — ABNORMAL HIGH (ref 0.61–1.24)
GFR, Estimated: 59 mL/min — ABNORMAL LOW (ref >60)
Glucose, Bld: 165 mg/dL — ABNORMAL HIGH (ref 70–99)
Potassium: 3 mmol/L — ABNORMAL LOW (ref 3.5–5.1)
Sodium: 134 mmol/L — ABNORMAL LOW (ref 135–145)
Total Bilirubin: 0.7 mg/dL (ref 0.3–1.2)
Total Protein: 7.8 g/dL (ref 6.5–8.1)

## 2022-11-19 LAB — URINALYSIS, ROUTINE W REFLEX MICROSCOPIC
Bacteria, UA: NONE SEEN
Bilirubin Urine: NEGATIVE
Glucose, UA: NEGATIVE mg/dL
Hgb urine dipstick: NEGATIVE
Ketones, ur: NEGATIVE mg/dL
Leukocytes,Ua: NEGATIVE
Nitrite: NEGATIVE
Protein, ur: 30 mg/dL — AB
Specific Gravity, Urine: 1.009 (ref 1.005–1.030)
pH: 5 (ref 5.0–8.0)

## 2022-11-19 LAB — CBC
HCT: 35.8 % — ABNORMAL LOW (ref 39.0–52.0)
Hemoglobin: 12.4 g/dL — ABNORMAL LOW (ref 13.0–17.0)
MCH: 31.4 pg (ref 26.0–34.0)
MCHC: 34.6 g/dL (ref 30.0–36.0)
MCV: 90.6 fL (ref 80.0–100.0)
Platelets: 240 10*3/uL (ref 150–400)
RBC: 3.95 MIL/uL — ABNORMAL LOW (ref 4.22–5.81)
RDW: 12.9 % (ref 11.5–15.5)
WBC: 9.8 10*3/uL (ref 4.0–10.5)
nRBC: 0 % (ref 0.0–0.2)

## 2022-11-19 LAB — LIPASE, BLOOD: Lipase: 30 U/L (ref 11–51)

## 2022-11-19 MED ORDER — IOHEXOL 300 MG/ML  SOLN
100.0000 mL | Freq: Once | INTRAMUSCULAR | Status: AC | PRN
  Administered 2022-11-19: 100 mL via INTRAVENOUS

## 2022-11-19 NOTE — ED Provider Notes (Signed)
[facility] Provider Note    Event Date/Time   First MD Initiated Contact with Patient 11/19/22 2315     (approximate)   History   Abdominal Pain    Mark Hendrix is a 62 y.o. male with history of hypertension, hyperlipidemia, diabetes, and arthritis who presents with lower abdominal pain which has been present intermittently for months.  The patient states that the pain is crampy in nature.  It mainly occurs when he is pulling something or leaning forward.  Will usually last for few minutes and then resolve on its own.  It has been present for several months but acutely worsened over the last couple days.  The patient had an episode today where it occurred more on the right side when it is usually on the left.  The patient denies any nausea or vomiting, diarrhea or change in his bowel movements, urinary symptoms, fever or chills, or other acute symptoms.  He has had no unintentional weight loss.  I reviewed the past medical records.  The patient's most recent outpatient encounter was with: Regular medical care center in September of last year for follow-up of his diabetes.  He has no recent ED visits or hospitalizations.   Physical Exam   Triage Vital Signs: ED Triage Vitals  Enc Vitals Group     BP 11/19/22 1903 (!) 141/71     Pulse Rate 11/19/22 1903 98     Resp 11/19/22 1903 19     Temp 11/19/22 1903 98.1 F (36.7 C)     Temp Source 11/19/22 1903 Oral     SpO2 11/19/22 1903 96 %     Weight --      Height --      Head Circumference --      Peak Flow --      Pain Score 11/19/22 1908 0     Pain Loc --      Pain Edu? --      Excl. in GC? --     Most recent vital signs: Vitals:   11/20/22 0000 11/20/22 0030  BP: (!) 172/84   Pulse: 88 88  Resp: 13 17  Temp:    SpO2: 97% 95%     General: Awake, no distress.  CV:  Good peripheral perfusion.  Resp:  Normal effort.  Abd:  Soft and nontender.  No distention.  Other:  No jaundice or  scleral icterus.   ED Results / Procedures / Treatments   Labs (all labs ordered are listed, but only abnormal results are displayed) Labs Reviewed  COMPREHENSIVE METABOLIC PANEL - Abnormal; Notable for the following components:      Result Value   Sodium 134 (*)    Potassium 3.0 (*)    CO2 21 (*)    Glucose, Bld 165 (*)    Creatinine, Ser 1.36 (*)    GFR, Estimated 59 (*)    All other components within normal limits  CBC - Abnormal; Notable for the following components:   RBC 3.95 (*)    Hemoglobin 12.4 (*)    HCT 35.8 (*)    All other components within normal limits  URINALYSIS, ROUTINE W REFLEX MICROSCOPIC - Abnormal; Notable for the following components:   Color, Urine YELLOW (*)    APPearance CLEAR (*)    Protein, ur 30 (*)    All other components within normal limits  LIPASE, BLOOD     EKG     RADIOLOGY  CT abdomen/pelvis: I  independently viewed and interpreted the images.  There are no dilated bowel loops or any free air or free fluid.  Radiology impression is as follows:  IMPRESSION:  No acute findings in the abdomen or pelvis. No evidence of ventral  wall or inguinal hernia.    Aortic atherosclerosis.    PROCEDURES:  Critical Care performed: No  Procedures   MEDICATIONS ORDERED IN ED: Medications  iohexol (OMNIPAQUE) 300 MG/ML solution 100 mL (100 mLs Intravenous Contrast Given 11/19/22 2346)     IMPRESSION / MDM / ASSESSMENT AND PLAN / ED COURSE  I reviewed the triage vital signs and the nursing notes.  62 year old male with PMH as noted above presents with crampy intermittent abdominal pain which has been present for months but acutely worsened in the last couple of days.  It is almost always associated with certain movements or exertion and is brief and self resolving.  Differential diagnosis includes, but is not limited to, abdominal wall muscle spasm or other musculoskeletal etiology, hernia, enteritis, colitis, malignancy.  Initial lab  workup is reassuring.  Urinalysis shows protein but no other acute findings.  LFTs and lipase are normal.  There is no leukocytosis or anemia.  Will obtain a CT for further evaluation.  Patient's presentation is most consistent with acute complicated illness / injury requiring diagnostic workup.  ----------------------------------------- 12:34 AM on 11/20/2022 -----------------------------------------   CT shows no acute findings.  Presentation is most consistent with abdominal wall muscle spasm.  I will prescribe the patient a trial of Flexeril to see if this helps.  He agrees to follow-up with his primary care provider.  Likely physical therapy referral would be the most beneficial.  I counseled patient on the results of the workup and plan of care.  I gave him strict return precautions and he expressed understanding.   FINAL CLINICAL IMPRESSION(S) / ED DIAGNOSES   Final diagnoses:  Abdominal wall pain     Rx / DC Orders   ED Discharge Orders          Ordered    cyclobenzaprine (FLEXERIL) 10 MG tablet  3 times daily PRN        11/20/22 0032             Note:  This document was prepared using Dragon voice recognition software and may include unintentional dictation errors.    Dionne Bucy, MD 11/20/22 0104

## 2022-11-19 NOTE — ED Triage Notes (Signed)
Pt states that he has been having lower abd pain that has been going on for the past 5-6 months, pain is worse today. Denies n/v/d

## 2022-11-20 MED ORDER — CYCLOBENZAPRINE HCL 10 MG PO TABS: 10.0000 mg | ORAL_TABLET | Freq: Three times a day (TID) | ORAL | 0 refills | Status: DC | PRN

## 2022-11-20 NOTE — Discharge Instructions (Addendum)
You may try taking the Flexeril as needed especially if you have a longer episode of the pain or cramping.  Follow-up with your primary care provider.  Return to the ER for new, worsening, or persistent severe pain, weakness, vomiting, fever, change in her bowel movements, or any other new or worsening symptoms that concern you.

## 2022-12-14 DIAGNOSIS — R070 Pain in throat: Secondary | ICD-10-CM | POA: Diagnosis not present

## 2022-12-14 DIAGNOSIS — Z20822 Contact with and (suspected) exposure to covid-19: Secondary | ICD-10-CM | POA: Diagnosis not present

## 2022-12-14 DIAGNOSIS — J069 Acute upper respiratory infection, unspecified: Secondary | ICD-10-CM | POA: Diagnosis not present

## 2022-12-25 ENCOUNTER — Other Ambulatory Visit: Payer: Self-pay | Admitting: Nurse Practitioner

## 2023-02-19 ENCOUNTER — Other Ambulatory Visit: Payer: Self-pay | Admitting: Nurse Practitioner

## 2023-02-24 DIAGNOSIS — E119 Type 2 diabetes mellitus without complications: Secondary | ICD-10-CM | POA: Diagnosis not present

## 2023-02-24 DIAGNOSIS — I10 Essential (primary) hypertension: Secondary | ICD-10-CM | POA: Diagnosis not present

## 2023-02-24 DIAGNOSIS — E785 Hyperlipidemia, unspecified: Secondary | ICD-10-CM | POA: Diagnosis not present

## 2023-02-26 DIAGNOSIS — E119 Type 2 diabetes mellitus without complications: Secondary | ICD-10-CM | POA: Diagnosis not present

## 2023-02-26 DIAGNOSIS — I10 Essential (primary) hypertension: Secondary | ICD-10-CM | POA: Diagnosis not present

## 2023-02-26 DIAGNOSIS — E781 Pure hyperglyceridemia: Secondary | ICD-10-CM | POA: Diagnosis not present

## 2023-03-05 DIAGNOSIS — F1010 Alcohol abuse, uncomplicated: Secondary | ICD-10-CM | POA: Diagnosis not present

## 2023-03-05 DIAGNOSIS — E119 Type 2 diabetes mellitus without complications: Secondary | ICD-10-CM | POA: Diagnosis not present

## 2023-03-05 DIAGNOSIS — I10 Essential (primary) hypertension: Secondary | ICD-10-CM | POA: Diagnosis not present

## 2023-03-05 DIAGNOSIS — E785 Hyperlipidemia, unspecified: Secondary | ICD-10-CM | POA: Diagnosis not present

## 2023-05-17 IMAGING — CT CT CHEST-ABD-PELV W/ CM
2 of 5 series · 13 of 36 positions shown, 15 images · IV contrast (agent unspecified)
Comparison: Chest x-ray 09/11/2021

CLINICAL DATA: Poly trauma fall

EXAM:
CT CHEST, ABDOMEN, AND PELVIS WITH CONTRAST
TECHNIQUE: Multidetector CT imaging of the chest, abdomen and pelvis was
performed following the standard protocol during bolus
administration of intravenous contrast.

[Series 2: cap with · axial · 0.88mm/px · z∈[+421,+956]mm · 10 of 131 slices shown, 12 images]
[im 12/131  mediastinal]
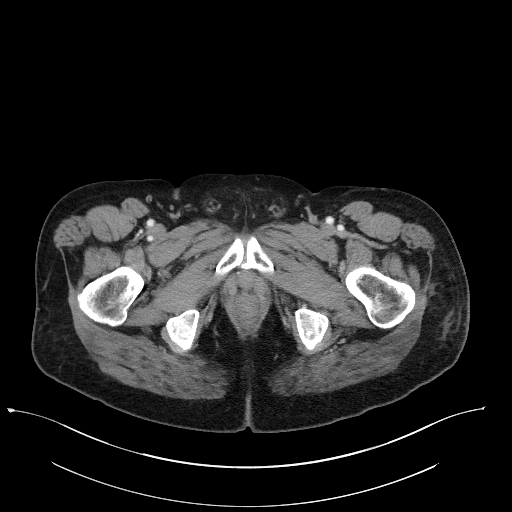
[im 12/131  bone]
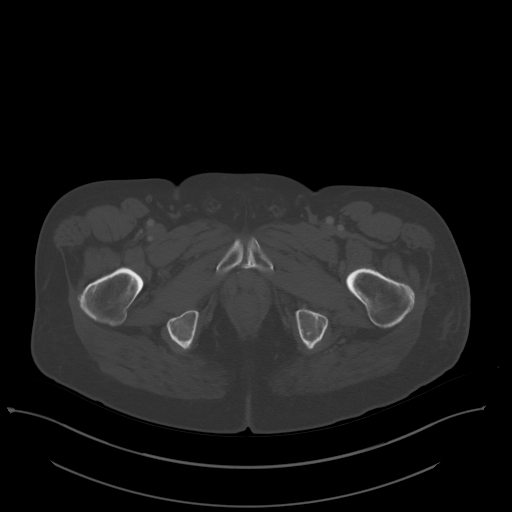
[im 24/131  mediastinal]
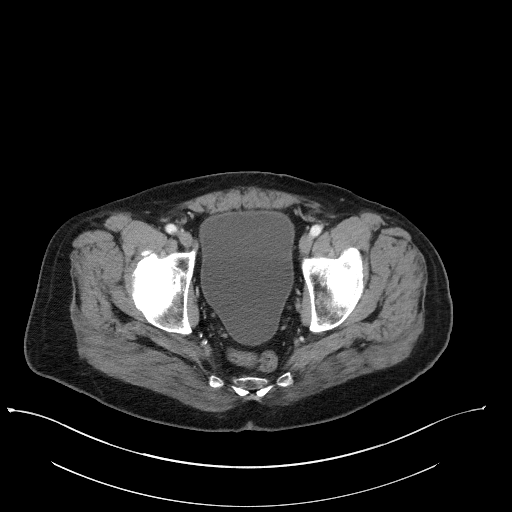
[im 36/131  mediastinal]
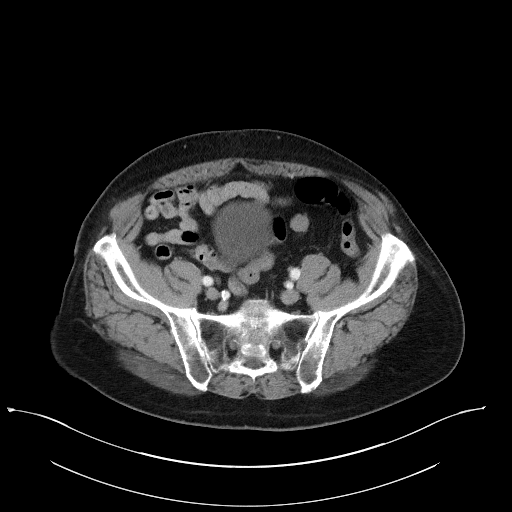
[im 48/131  mediastinal]
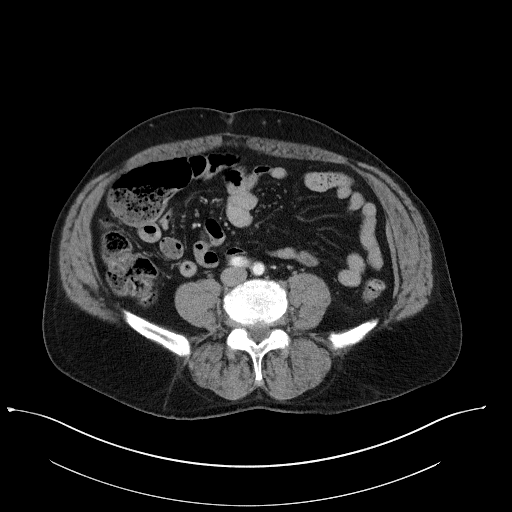
[im 60/131  mediastinal]
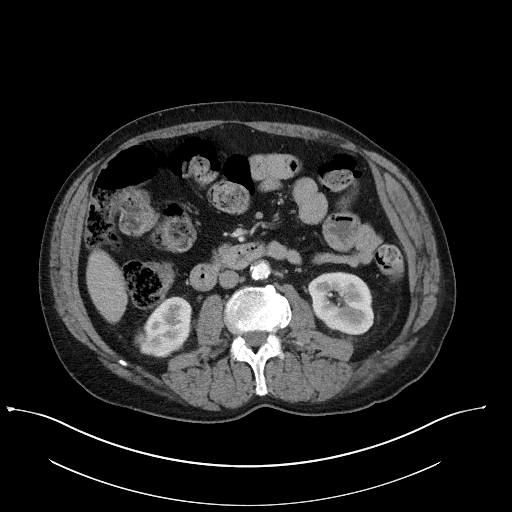
[im 71/131  mediastinal]
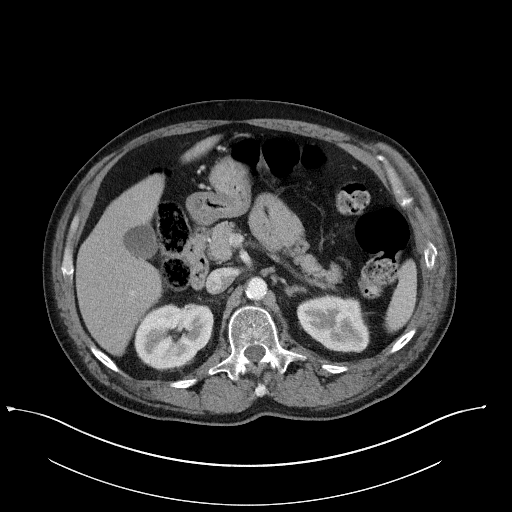
[im 83/131  mediastinal]
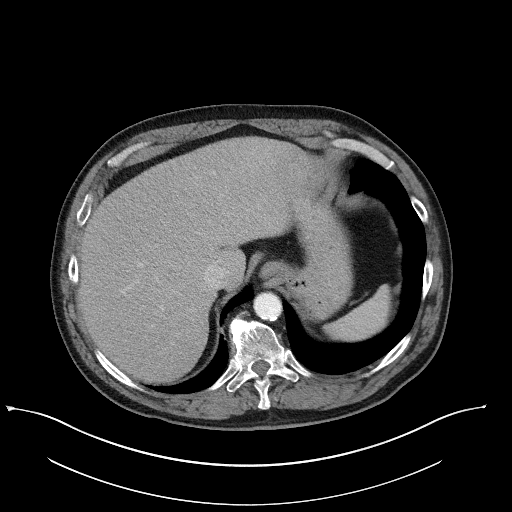
[im 95/131  mediastinal]
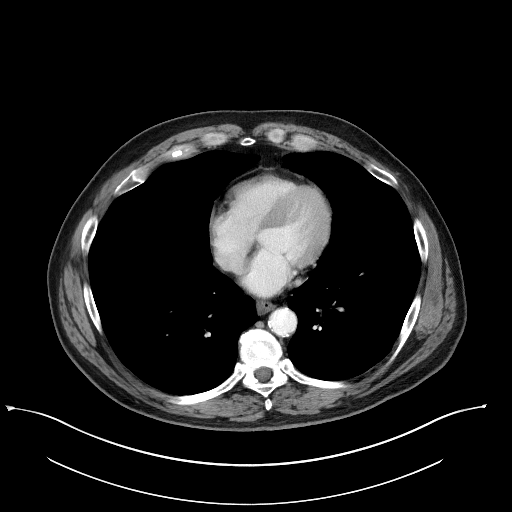
[im 107/131  mediastinal]
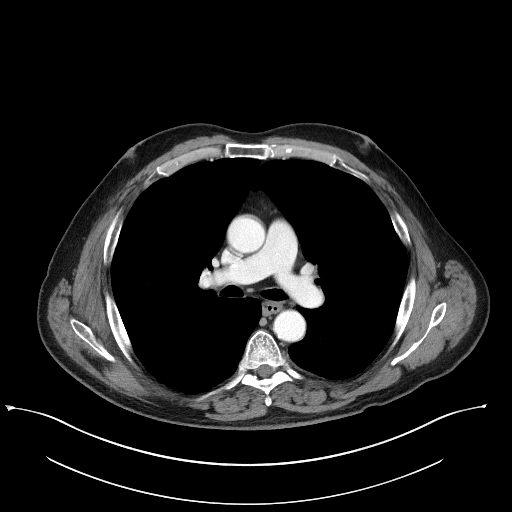
[im 107/131  bone]
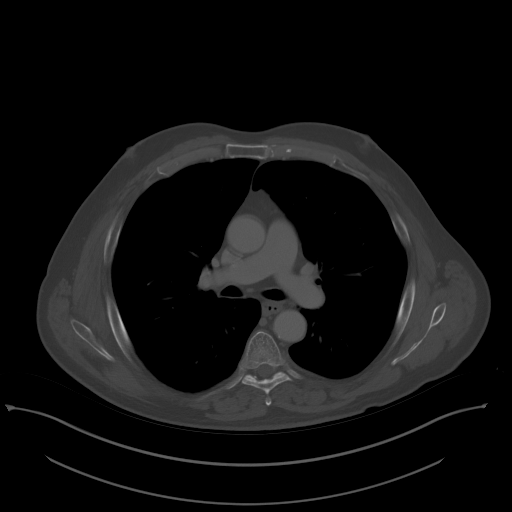
[im 119/131  mediastinal]
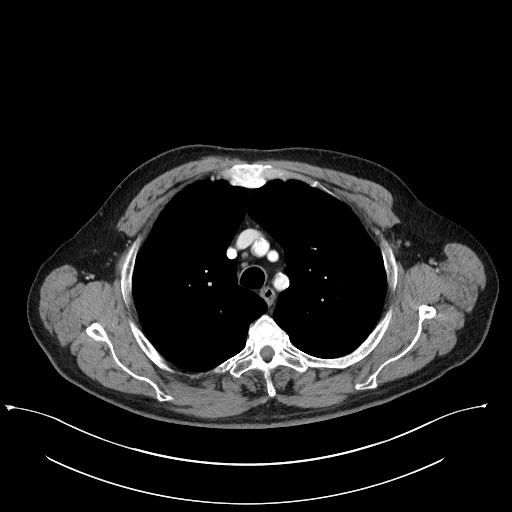

[Series 5: coronals · coronal · 0.76mm/px · 3 of 149 slices shown]
[im 30/149  mediastinal]
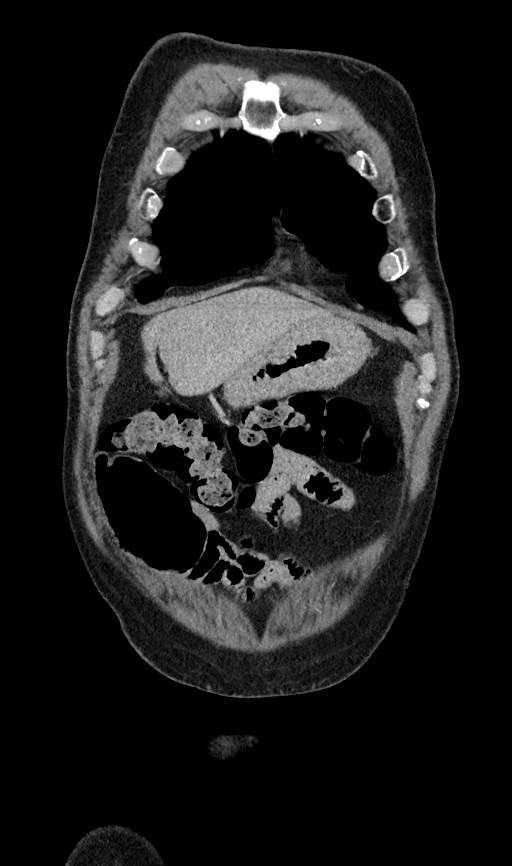
[im 60/149  mediastinal]
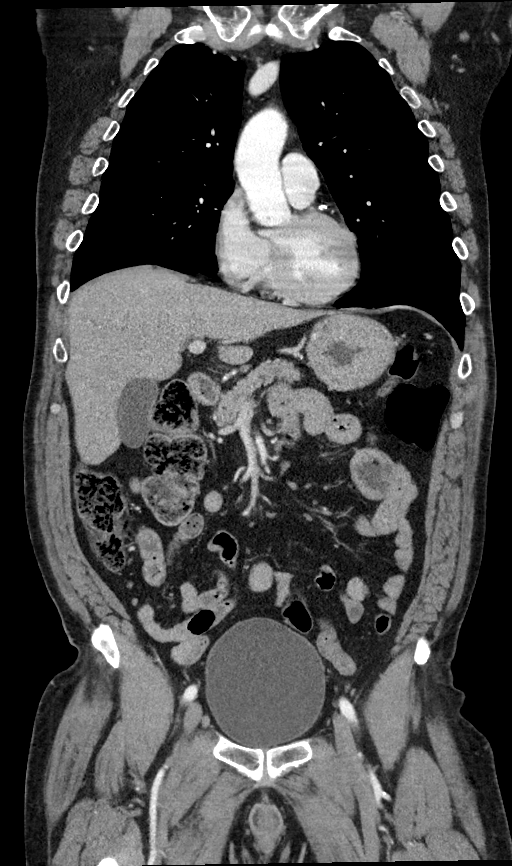
[im 89/149  mediastinal]
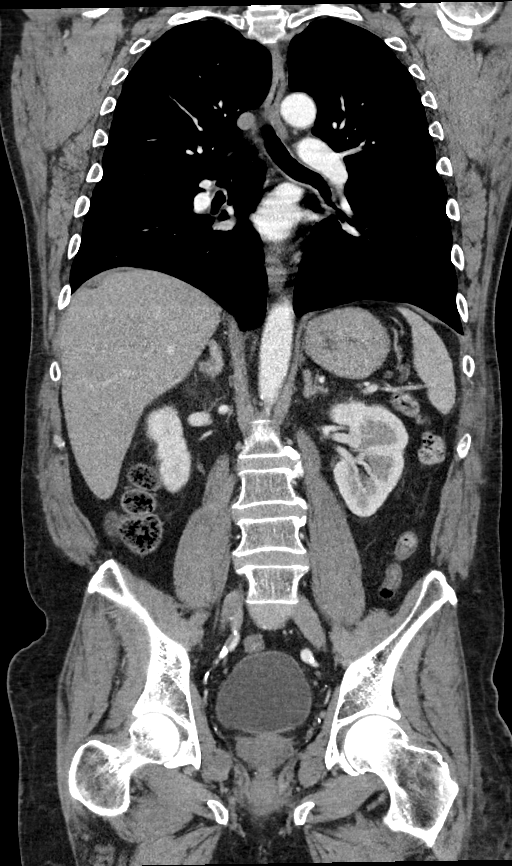

[13 of 36 positions shown; findings below may reference images not displayed]

RADIATION DOSE REDUCTION: This exam was performed according to the
departmental dose-optimization program which includes automated
exposure control, adjustment of the mA and/or kV according to
patient size and/or use of iterative reconstruction technique.

CONTRAST:  100mL OMNIPAQUE IOHEXOL 300 MG/ML  SOLN
FINDINGS: CT CHEST FINDINGS

Cardiovascular: Nonaneurysmal aorta. Mild atherosclerosis. Coronary
vascular calcification. Normal cardiac size. No pericardial effusion

Mediastinum/Nodes: Inline trachea. No thyroid mass. No suspicious
lymph nodes. Esophagus within normal limits

Lungs/Pleura: Mild apical emphysema. No pneumothorax or pleural
effusion. No acute airspace disease

Musculoskeletal: Sternum is intact. Acute left ninth tenth and
eleventh anterolateral to lateral rib fractures. Posterior rib
fracture also at the left eleventh rib.

CT ABDOMEN PELVIS FINDINGS

Hepatobiliary: No focal liver abnormality is seen. No gallstones,
gallbladder wall thickening, or biliary dilatation.

Pancreas: Unremarkable. No pancreatic ductal dilatation or
surrounding inflammatory changes.

Spleen: Normal in size without focal abnormality.

Adrenals/Urinary Tract: Adrenal glands are unremarkable. Kidneys are
normal, without renal calculi, focal lesion, or hydronephrosis.
Bladder is unremarkable.

Stomach/Bowel: Mild diffuse gastric wall thickening. No dilated
small bowel. No acute bowel wall thickening. Negative appendix.

Vascular/Lymphatic: Moderate aortic atherosclerosis. No aneurysm. No
suspicious lymph nodes

Reproductive: Prostate is unremarkable.

Other: Negative for pelvic effusion or free air.

Musculoskeletal: No acute osseous abnormality.
IMPRESSION: 1. No CT evidence for acute intrathoracic injury. No pneumothorax.
There are acute mildly displaced left ninth through eleventh rib
fractures, there is segmental fracture of the left eleventh rib.
2. No CT evidence for acute intra-abdominal or pelvic abnormality
3. Mild diffuse gastric wall thickening, could be due to under
distension versus gastritis.

## 2023-05-17 IMAGING — DX DG TIBIA/FIBULA 2V*L*
4 series · 4 of 4 positions shown · non-contrast
Comparison: None Available.

CLINICAL DATA: Recent fall with left lower leg pain, initial
encounter

EXAM:
LEFT TIBIA AND FIBULA - 2 VIEW

[tibia ap (1 of 2)]
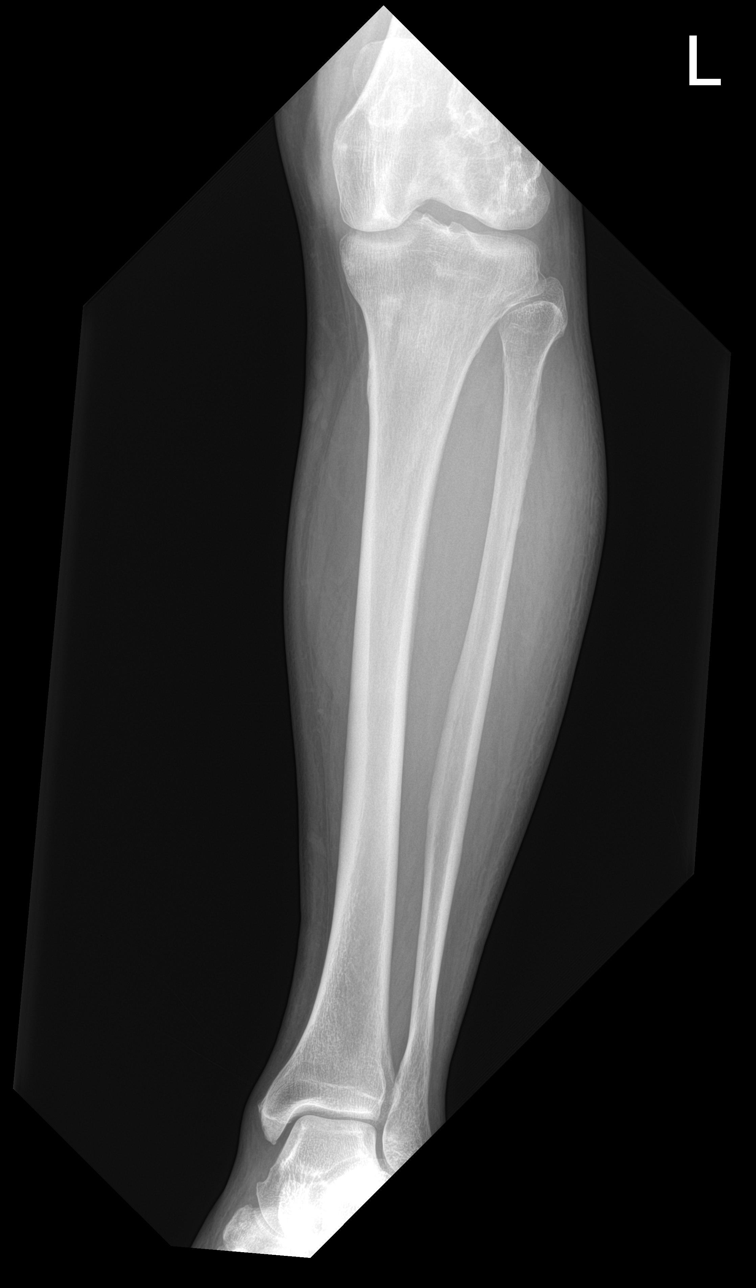

[tibia ap (2 of 2)]
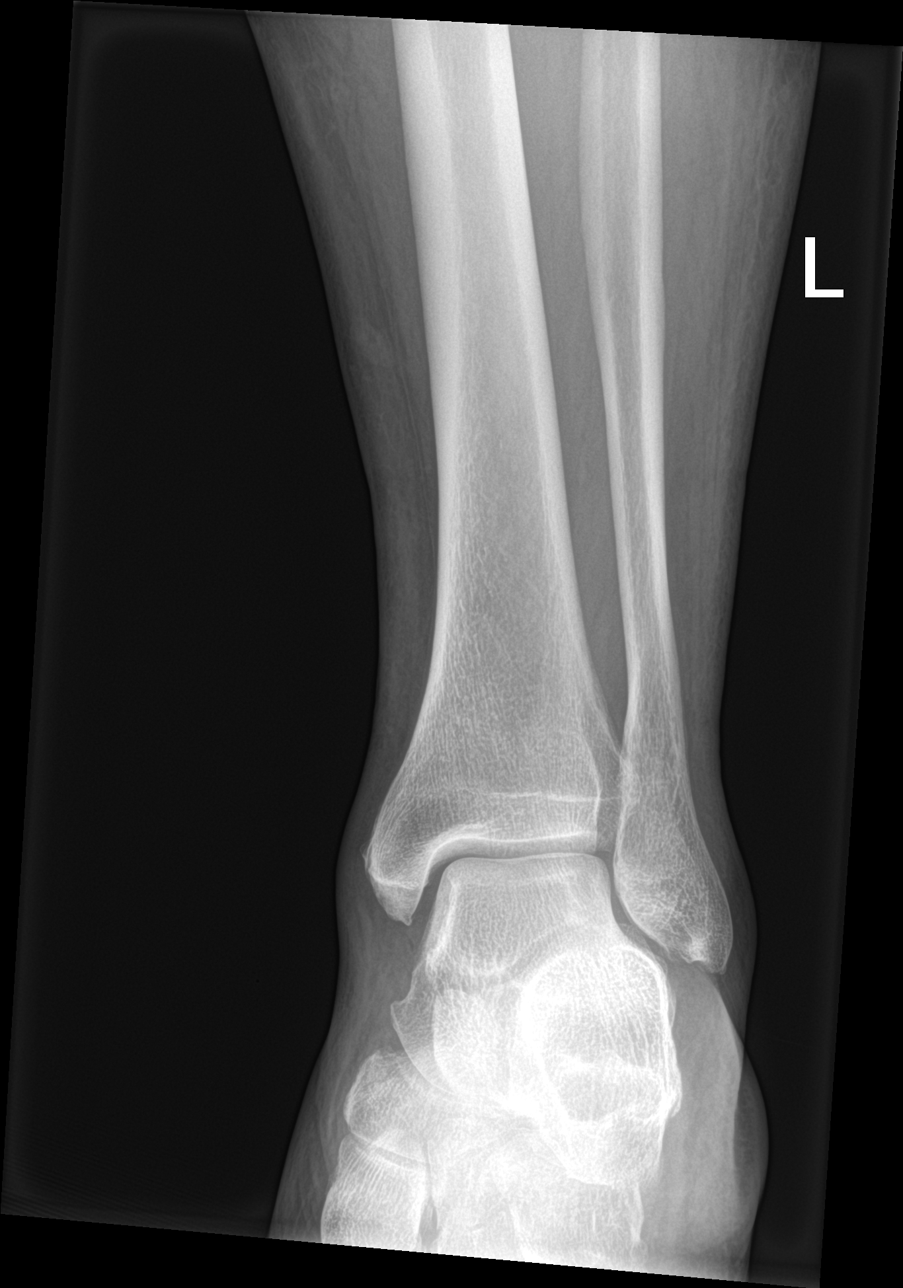

[tibia lat (1 of 2)]
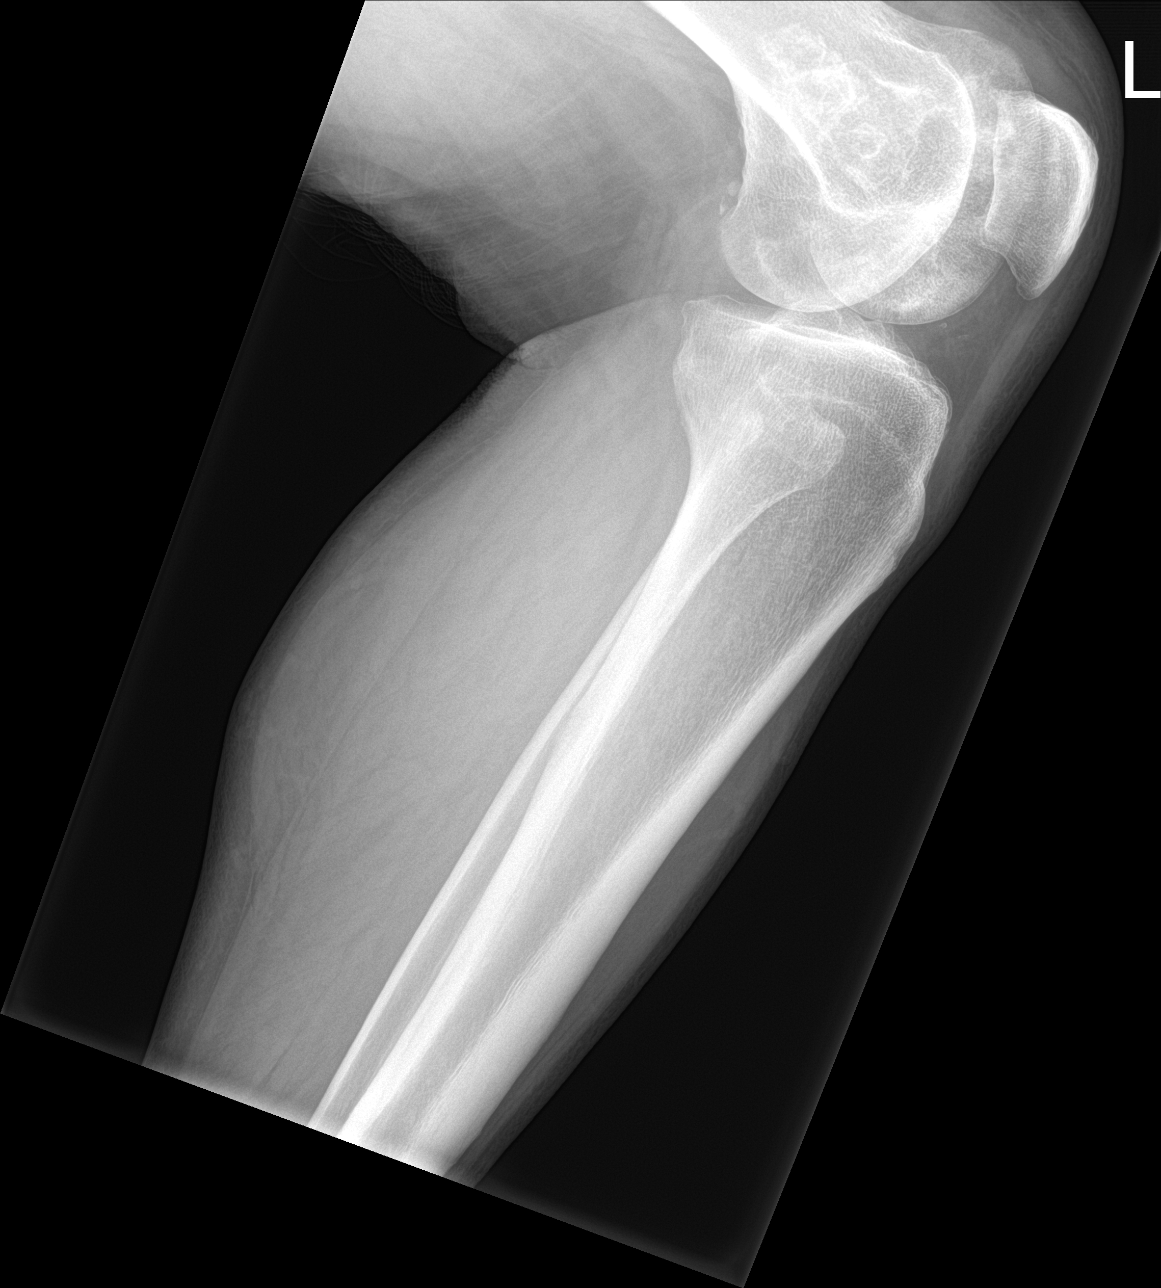

[tibia lat (2 of 2)]
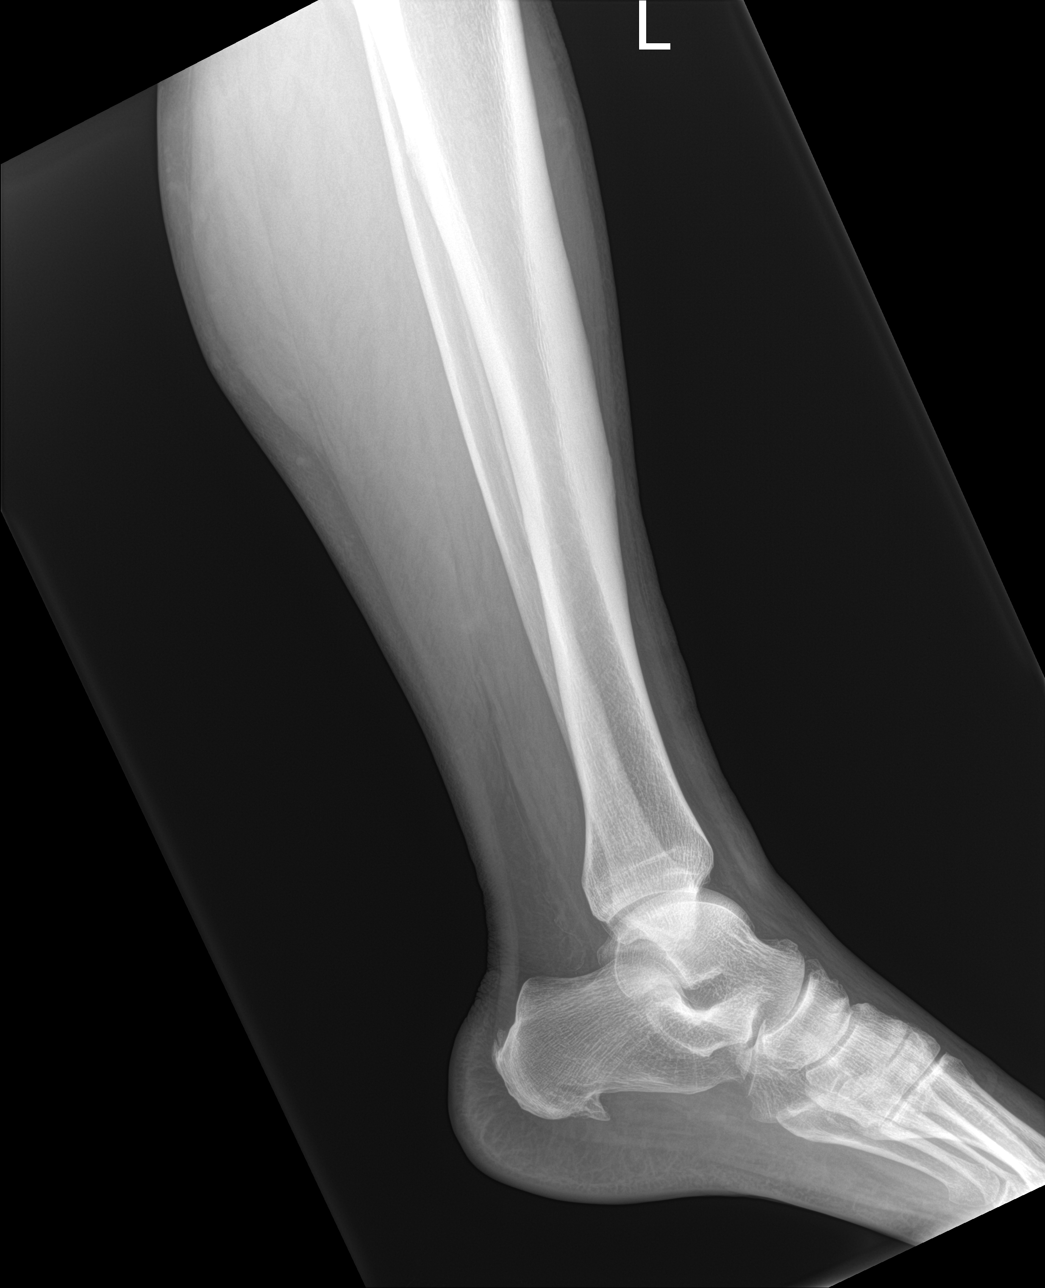

[4 of 4 positions shown; findings below may reference images not displayed]

FINDINGS: No acute fracture or dislocation is noted. Calcaneal spurring is
seen. No soft tissue abnormality is noted.
IMPRESSION: No acute abnormality noted.

## 2023-05-17 IMAGING — CR DG HIP (WITH OR WITHOUT PELVIS) 2-3V*L*
1 series · 3 of 3 positions shown · non-contrast
Comparison: None Available.

CLINICAL DATA: Fall with left-sided hip pain

EXAM:
DG HIP (WITH OR WITHOUT PELVIS) 2-3V LEFT

[Series 1: dg hip unilat w or w/o pelvis 2-3 views  · non-contrast · 0.14mm/px · 3 of 3 slices shown]
[im 1/3]
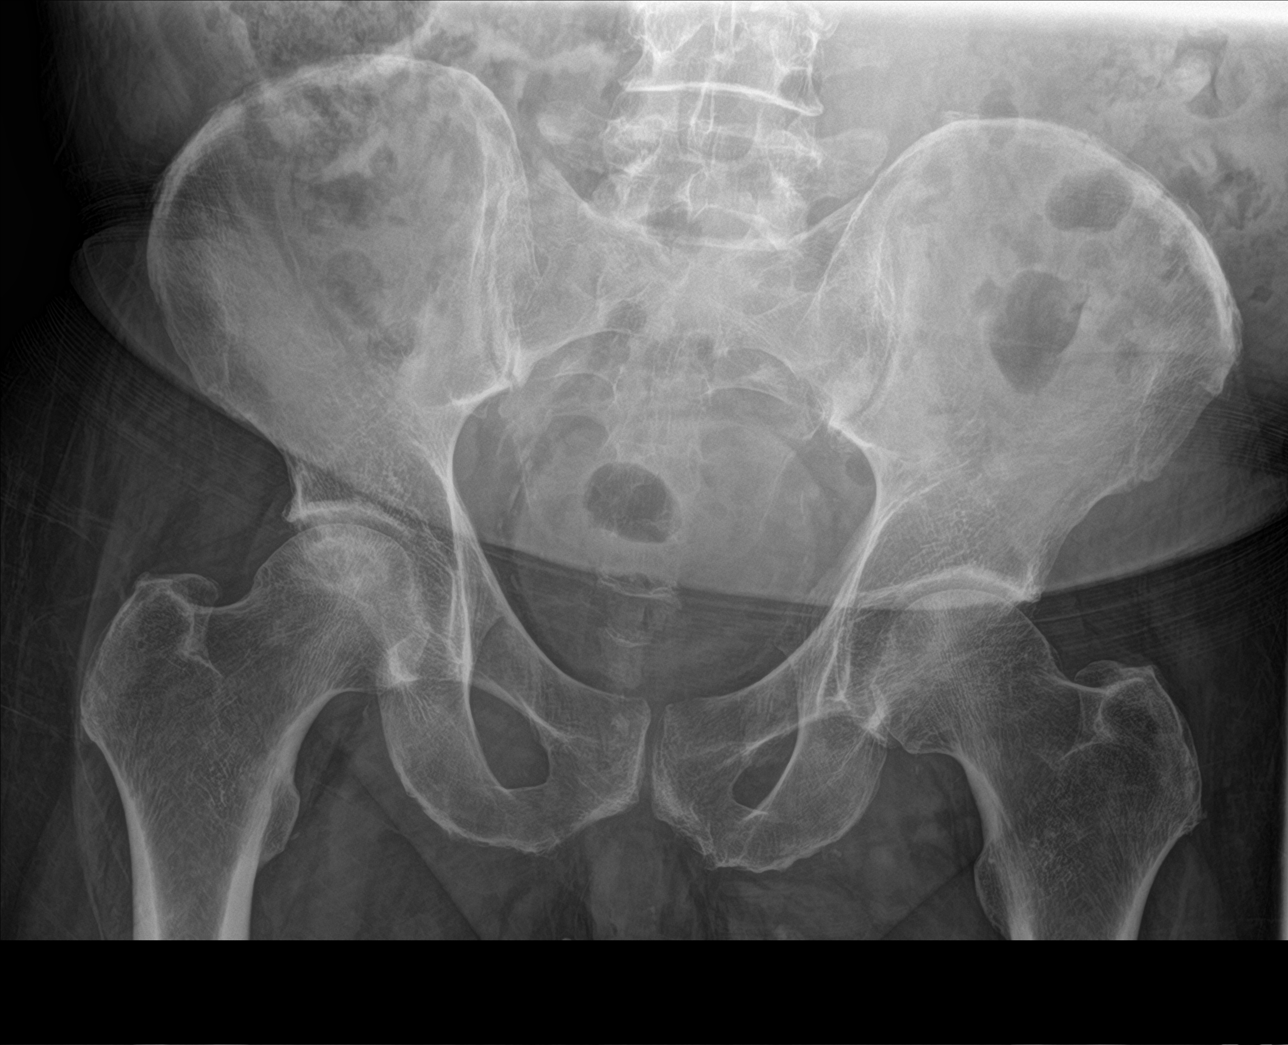
[im 2/3]
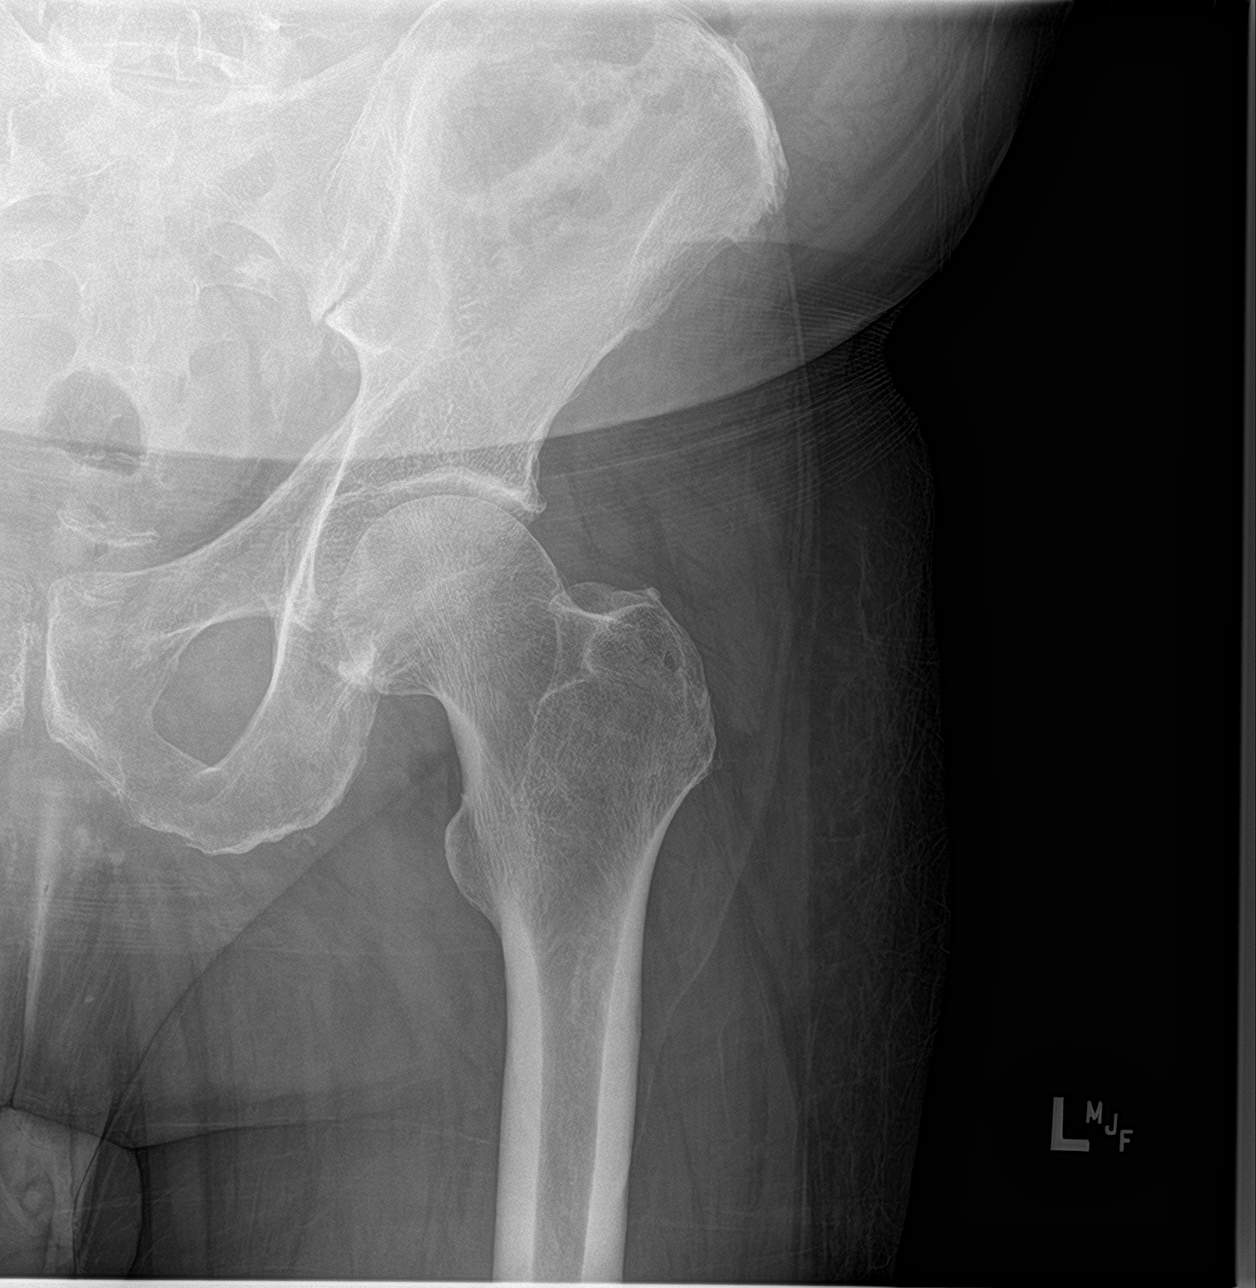
[im 3/3]
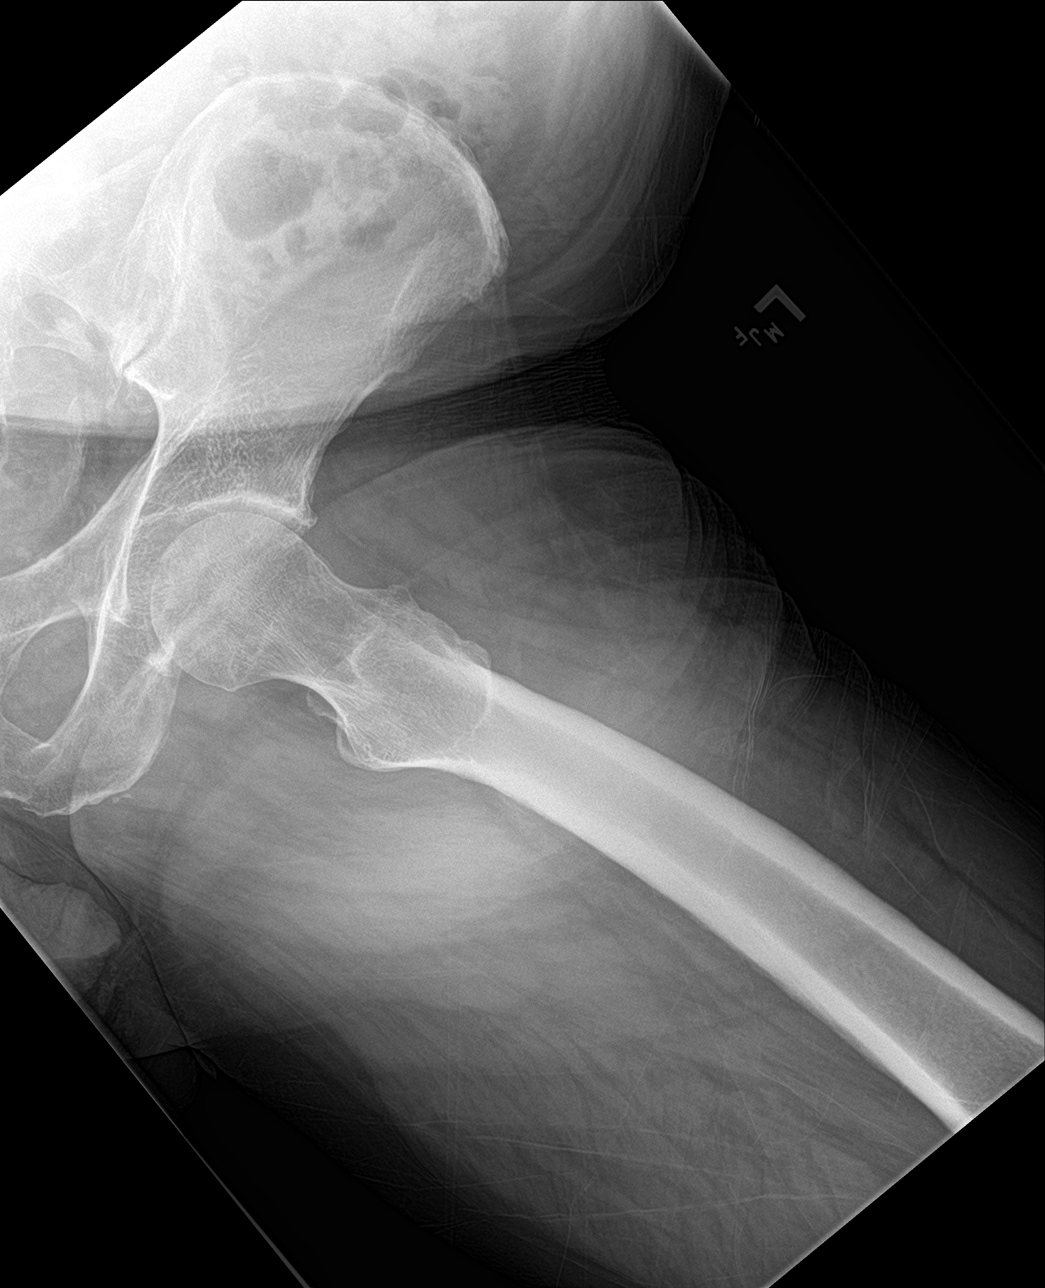

[3 of 3 positions shown; findings below may reference images not displayed]

FINDINGS: No fracture or malalignment. Mild degenerative changes of the left
hip. Possible sclerosis and lucency within the right femoral head.
IMPRESSION: 1. No acute osseous abnormality. Mild degenerative changes of the
left hip
2. Possible AVN of the right femoral head.  No evidence for collapse

## 2023-07-02 ENCOUNTER — Ambulatory Visit: Payer: Self-pay | Admitting: Internal Medicine

## 2023-07-02 NOTE — Telephone Encounter (Signed)
 Chief Complaint: Breathing difficulty Symptoms: Dizzy when coughing, SOB, wheezing, unable to speak at times Frequency: Constant Disposition: [x] ED /[] Urgent Care (no appt availability in office) / [] Appointment(In office/virtual)/ []  [facility] [place] [facility]/ [] Home Care/ [] Refused Recommended Disposition /[] [facility]/ []  Follow-up with PCP Additional Notes: Spoke with pt's wife, Marylene Land. Pt wife states pt has been having difficulty breathing for a while but it is now getting worse. Pt is a smoker. Pt wife advised to take pt to ED. Pt wife states understanding and agrees to plan.    Copied from CRM [id number]. Topic: Clinical - Red Word Triage >> Jul 02, 2023  3:41 PM Kristie Cowman wrote: Patient's wife states the patient has been having more difficulty breathing in the last few days.  Patient is a smoker. Reason for Disposition  [1] MODERATE difficulty breathing (e.g., speaks in phrases, SOB even at rest, pulse 100-120) AND [2] NEW-onset or WORSE than normal  Answer Assessment - Initial Assessment Questions 1. RESPIRATORY STATUS: "Describe your breathing?" (e.g., wheezing, shortness of breath, unable to speak, severe coughing)      All of the above 2. ONSET: "When did this breathing problem begin?"      The past couple months it got worse 3. PATTERN "Does the difficult breathing come and go, or has it been constant since it started?"      Constant 4. SEVERITY: "How bad is your breathing?" (e.g., mild, moderate, severe)    - MILD: No SOB at rest, mild SOB with walking, speaks normally in sentences, can lie down, no retractions, pulse < 100.    - MODERATE: SOB at rest, SOB with minimal exertion and prefers to sit, cannot lie down flat, speaks in phrases, mild retractions, audible wheezing, pulse 100-120.    - SEVERE: Very SOB at rest, speaks in single words, struggling to breathe, sitting hunched forward, retractions, pulse > 120      Severe when he gets up early in morning; during day  he is short-winded 8. CAUSE: "What do you think is causing the breathing problem?"      Smoking 9. OTHER SYMPTOMS: "Do you have any other symptoms? (e.g., dizziness, runny nose, cough, chest pain, fever)     Dizziness when coughing 10. O2 SATURATION MONITOR:  "Do you use an oxygen saturation monitor (pulse oximeter) at home?" If Yes, ask: "What is your reading (oxygen level) today?" "What is your usual oxygen saturation reading?" (e.g., 95%)       Denies  Protocols used: Breathing Difficulty-A-AH

## 2023-07-02 NOTE — Telephone Encounter (Signed)
 Dr Juel Burrow is no longer part of [facility]. This patient is not established with our office but saw KAur in 2023, who was with [facility] at the time.  He would need to establish care with a new primary care provider or Mrs Thornell Mule, NP

## 2023-09-03 ENCOUNTER — Ambulatory Visit: Payer: Self-pay

## 2023-09-03 NOTE — Telephone Encounter (Signed)
 Message from Allyne Areola sent at 09/03/2023 10:32 AM EDT  Copied From CRM [id number]. Reason for Triage: Patient is calling because he tried to get a refill for simvastatin  (ZOCOR ) 20 MG tablet  and hydrochlorothiazide  (HYDRODIURIL ) 25 MG tablet and was informed that his primary care provider has left the practice. Tried to schedule a new patient appointment; however, there were no availability soon and he is completely out. He would like to know if there is any  way to get a refill to hold him over. I advised that patient had an appointment scheduled for tomorrow and they would not be able to make it due to work schedule conflict. Best call back number is  [phone].

## 2023-09-03 NOTE — Telephone Encounter (Signed)
 NT called patient and patient at work, spoke to pt wife Shelvy Dickens on [place].    Chief Complaint: out of medication and does not have new PCP , per patient wife.  Symptoms: out of medications Frequency: took last medications today Pertinent Negatives: Patient denies na Disposition: [] ED /[] Urgent Care (no appt availability in office) / [] Appointment(In office/virtual)/ []  [facility]/ [] Home Care/ [] Refused Recommended Disposition /[x]  Mobile Bus/ []  Follow-up with PCP Additional Notes:   Reviewed options regarding medication refills/ renewal of Rx with patient wife. Wife reports patient was unaware PCP closed office and he did not receive any notification office was closing. Recommended patient will have to go to UC/ED or mobile bus to requesting prescription of medications until new patient appt at [facility] scheduled for 09/30/23. Patient had to cancel appt for tomorrow due to could not get off work. Gave address for mobile bus today .      Reason for Disposition  Prescription request for new medicine (not a refill)  Answer Assessment - Initial Assessment Questions 1. NAME of MEDICINE: "What medicine(s) are you calling about?"     All prescribed medications, today zocor  20 mg and hydrodiuril  25 mg 2. QUESTION: "What is your question?" (e.g., double dose of medicine, side effect)     How can we get medications refilled due to previous PCP no longer in practice and practice closed? 3. PRESCRIBER: "Who prescribed the medicine?" Reason: if prescribed by specialist, call should be referred to that group.     Avanell Bob, NP  4. SYMPTOMS: "Do you have any symptoms?" If Yes, ask: "What symptoms are you having?"  "How bad are the symptoms (e.g., mild, moderate, severe)     Out of medications 5. PREGNANCY:  "Is there any chance that you are pregnant?" "When was your last menstrual period?"     na  Protocols used: Medication Question Call-A-AH

## 2023-09-03 NOTE — Telephone Encounter (Signed)
 Called back patient's preferred phone number from CRM. Shelvy Dickens, patient's wife, answered and states the patient is at work and she is as well. She states 3:45pm or 4pm is better to call back their house phone # [phone].  Copied from CRM [id number]. Topic: Clinical - Pink Word Triage >> Sep 03, 2023 10:27 AM Allyne Areola wrote: Reason for Triage: Patient is calling because he tried to get a refill for simvastatin  (ZOCOR ) 20 MG tablet  and hydrochlorothiazide  (HYDRODIURIL ) 25 MG tablet and was informed that his primary care provider has left the practice. Tried to schedule a new patient appointment; however, there were no availability soon and he is completely out. He would like to know if there is any  way to get a refill to hold him over. I advised that patient had an appointment scheduled for tomorrow and they would not be able to make it due to work schedule conflict. Best call back number is  [phone].

## 2023-09-04 ENCOUNTER — Ambulatory Visit: Admitting: Internal Medicine

## 2023-09-06 DIAGNOSIS — Z760 Encounter for issue of repeat prescription: Secondary | ICD-10-CM | POA: Diagnosis not present

## 2023-09-06 DIAGNOSIS — M62838 Other muscle spasm: Secondary | ICD-10-CM | POA: Diagnosis not present

## 2023-09-06 DIAGNOSIS — E785 Hyperlipidemia, unspecified: Secondary | ICD-10-CM | POA: Diagnosis not present

## 2023-09-06 DIAGNOSIS — I10 Essential (primary) hypertension: Secondary | ICD-10-CM | POA: Diagnosis not present

## 2023-09-06 DIAGNOSIS — E1165 Type 2 diabetes mellitus with hyperglycemia: Secondary | ICD-10-CM | POA: Diagnosis not present

## 2023-09-30 ENCOUNTER — Encounter: Payer: Self-pay | Admitting: Family Medicine

## 2023-09-30 ENCOUNTER — Ambulatory Visit: Admitting: Family Medicine

## 2023-09-30 VITALS — BP 136/82 | HR 91 | Resp 16 | Ht 67.0 in | Wt 202.0 lb

## 2023-09-30 DIAGNOSIS — F17200 Nicotine dependence, unspecified, uncomplicated: Secondary | ICD-10-CM

## 2023-09-30 DIAGNOSIS — R809 Proteinuria, unspecified: Secondary | ICD-10-CM

## 2023-09-30 DIAGNOSIS — K219 Gastro-esophageal reflux disease without esophagitis: Secondary | ICD-10-CM

## 2023-09-30 DIAGNOSIS — E785 Hyperlipidemia, unspecified: Secondary | ICD-10-CM

## 2023-09-30 DIAGNOSIS — E1165 Type 2 diabetes mellitus with hyperglycemia: Secondary | ICD-10-CM | POA: Diagnosis not present

## 2023-09-30 DIAGNOSIS — Z5181 Encounter for therapeutic drug level monitoring: Secondary | ICD-10-CM

## 2023-09-30 DIAGNOSIS — E66811 Obesity, class 1: Secondary | ICD-10-CM

## 2023-09-30 DIAGNOSIS — R0609 Other forms of dyspnea: Secondary | ICD-10-CM | POA: Diagnosis not present

## 2023-09-30 DIAGNOSIS — Z23 Encounter for immunization: Secondary | ICD-10-CM

## 2023-09-30 DIAGNOSIS — D649 Anemia, unspecified: Secondary | ICD-10-CM

## 2023-09-30 DIAGNOSIS — Z6831 Body mass index (BMI) 31.0-31.9, adult: Secondary | ICD-10-CM

## 2023-09-30 DIAGNOSIS — Z0000 Encounter for general adult medical examination without abnormal findings: Secondary | ICD-10-CM

## 2023-09-30 DIAGNOSIS — L409 Psoriasis, unspecified: Secondary | ICD-10-CM

## 2023-09-30 DIAGNOSIS — I10 Essential (primary) hypertension: Secondary | ICD-10-CM

## 2023-09-30 MED ORDER — BETAMETHASONE VALERATE 0.1 % EX OINT
1.0000 | TOPICAL_OINTMENT | Freq: Two times a day (BID) | CUTANEOUS | 2 refills | Status: AC
Start: 2023-09-30 — End: ?

## 2023-09-30 MED ORDER — ALBUTEROL SULFATE (2.5 MG/3ML) 0.083% IN NEBU
2.5000 mg | INHALATION_SOLUTION | Freq: Once | RESPIRATORY_TRACT | Status: AC
Start: 2023-09-30 — End: 2023-09-30
  Administered 2023-09-30: 2.5 mg via RESPIRATORY_TRACT

## 2023-09-30 MED ORDER — OMEPRAZOLE 40 MG PO CPDR: 40.0000 mg | DELAYED_RELEASE_CAPSULE | Freq: Every day | ORAL | 3 refills | Status: DC

## 2023-09-30 NOTE — Progress Notes (Unsigned)
 Name: Mark Hendrix   MRN: [id number]    DOB: December 07, 1960   Date:09/30/2023       Progress Note  Chief Complaint  Patient presents with   Establish Care    Subjective:   Mark Hendrix is a 63 y.o. male, presents to clinic to establish care, his PCP was Dr. Loetta Ringer who retired recently  Last OV with prior PCP was in their office with NP in 2023 - no care or OV for the past 2 years  Hx of DM previously needing insulin, but not on insulin for a shile, he thinks his PCP gave him a different PO med in place of insulin to "avoid weight gain" Lab Results  Component Value Date   HGBA1C 10.0 (H) 01/04/2022  Last labs 2023 uncontrolled, last meds on chart from Rx in 2023 and 2024 include januvia , actos , metformin   HTN on HCTZ diltiazem , cardura ? And HTCZ Hx of angioedema to ACEI BP Readings from Last 3 Encounters:  09/30/23 136/82  11/20/22 (!) 172/84  02/08/22 (!) 150/86   HLD just got refill on simvastatin    Reviewed with pt and spouse info in chart and records/labs from PCP - they state he has been seen more recently than what is available in EMR - we will request records from clinic     Current Outpatient Medications:    bacitracin 500 UNIT/GM ointment, Apply 1 Application topically daily., Disp: , Rfl:    blood glucose meter kit and supplies, Dispense based on patient and insurance preference. Use up to four times daily as directed. (FOR ICD-10 E10.9, E11.9)., Disp: 1 each, Rfl: 6   diltiazem  (CARDIZEM  CD) 240 MG 24 hr capsule, TAKE 1 CAPSULE BY MOUTH ONCE DAILY, Disp: 90 capsule, Rfl: 3   doxazosin  (CARDURA ) 4 MG tablet, TAKE 1 TABLET BY MOUTH ONCE DAILY, Disp: 90 tablet, Rfl: 3   hydrochlorothiazide  (HYDRODIURIL ) 25 MG tablet, Take 1 tablet (25 mg total) by mouth daily. (Patient taking differently: Take 12.5 mg by mouth daily.), Disp: 90 tablet, Rfl: 3   metFORMIN  (GLUCOPHAGE -XR) 500 MG 24 hr tablet, TAKE 1 TABLET BY MOUTH TWICE DAILY, Disp: 60 tablet, Rfl: 0   Multiple  Vitamin (MULTI-VITAMIN) tablet, Take 1 tablet by mouth daily., Disp: , Rfl:    omeprazole (PRILOSEC) 40 MG capsule, TAKE 1 CAPSULE BY MOUTH ONCE DAILY, Disp: 90 capsule, Rfl: 3   pioglitazone  (ACTOS ) 30 MG tablet, Take 1 tablet (30 mg total) by mouth daily., Disp: 90 tablet, Rfl: 3   simvastatin  (ZOCOR ) 20 MG tablet, Take 1 tablet (20 mg total) by mouth daily., Disp: 90 tablet, Rfl: 3   cyclobenzaprine  (FLEXERIL ) 10 MG tablet, Take 1 tablet (10 mg total) by mouth 3 (three) times daily as needed for muscle spasms. (Patient not taking: Reported on 09/30/2023), Disp: 15 tablet, Rfl: 0   ibuprofen (ADVIL,MOTRIN) 800 MG tablet, Take 800 mg by mouth every 8 (eight) hours as needed for pain. (Patient not taking: Reported on 09/30/2023), Disp: , Rfl:    sitaGLIPtin  (JANUVIA ) 50 MG tablet, Take 1 tablet (50 mg total) by mouth daily. (Patient not taking: Reported on 09/30/2023), Disp: 30 tablet, Rfl: 1  Patient Active Problem List   Diagnosis Date Noted   Class 1 obesity with serious comorbidity and body mass index (BMI) of 31.0 to 31.9 in adult 09/30/2023   Anemia 09/30/2023   Positive for microalbuminuria 09/30/2023   Need for influenza vaccination 02/09/2022   Tobacco abuse counseling 01/24/2022   Chest pain 03/05/2013  Smoker 03/05/2013   Bronchitis 03/05/2013   History of angioedema 03/05/2013   Essential hypertension 03/05/2013   Uncontrolled type 2 diabetes mellitus with hyperglycemia (HCC) 03/05/2013   Hyperlipidemia 03/05/2013    Past Surgical History:  Procedure Laterality Date   CATARACT EXTRACTION Bilateral    COLONOSCOPY WITH PROPOFOL  N/A 07/12/2016   Procedure: COLONOSCOPY WITH PROPOFOL ;  Surgeon: Deveron Fly, MD;  Location: [facility] ENDOSCOPY;  Service: Endoscopy;  Laterality: N/A;    Family History  Problem Relation Age of Onset   Diabetes Mother    Diabetes Sister     Social History   Tobacco Use   Smoking status: Every Day    Current packs/day: 1.00    Average  packs/day: 1 pack/day for 40.0 years (40.0 ttl pk-yrs)    Types: Cigarettes   Smokeless tobacco: Never  Substance Use Topics   Alcohol use: Yes    Alcohol/week: 6.0 standard drinks of alcohol    Types: 6 Cans of beer per week    Comment: daily   Drug use: No     Allergies  Allergen Reactions   Ace Inhibitors Swelling    Health Maintenance  Topic Date Due   OPHTHALMOLOGY EXAM  Never done   Zoster Vaccines- Shingrix (1 of 2) Never done   FOOT EXAM  02/09/2023   COVID-19 Vaccine (4 - 2024-25 season) 10/16/2023 (Originally 01/12/2023)   Lung Cancer Screening  09/29/2024 (Originally 09/12/2022)   Pneumococcal Vaccine 43-36 Years old (1 of 2 - PCV) 09/29/2024 (Originally 06/12/1979)   INFLUENZA VACCINE  12/12/2023   HEMOGLOBIN A1C  04/01/2024   Diabetic kidney evaluation - eGFR measurement  09/29/2024   Diabetic kidney evaluation - Urine ACR  09/29/2024   Colonoscopy  07/13/2026   DTaP/Tdap/Td (2 - Td or Tdap) 04/23/2031   Hepatitis C Screening  Completed   HIV Screening  Completed   HPV VACCINES  Aged Out   Meningococcal B Vaccine  Aged Out    Chart Review Today: I personally reviewed active problem list, medication list, allergies, family history, social history, health maintenance, notes from last encounter, lab results, imaging with the patient/caregiver today.   Review of Systems  All other systems reviewed and are negative.    Objective:   Vitals:   09/30/23 1324  BP: 136/82  Pulse: 91  Resp: 16  SpO2: 96%  Weight: 202 lb (91.6 kg)  Height: 5\' 7"  (1.702 m)    Body mass index is 31.64 kg/m.  Physical Exam Vitals and nursing note reviewed.  Constitutional:      General: He is not in acute distress.    Appearance: Normal appearance. He is well-developed. He is not ill-appearing, toxic-appearing or diaphoretic.  HENT:     Head: Normocephalic and atraumatic.     Right Ear: External ear normal.     Left Ear: External ear normal.     Nose: Nose normal.  Eyes:      General: No scleral icterus.       Right eye: No discharge.        Left eye: No discharge.     Conjunctiva/sclera: Conjunctivae normal.  Neck:     Trachea: No tracheal deviation.  Cardiovascular:     Rate and Rhythm: Normal rate.     Pulses: Normal pulses.     Heart sounds: Normal heart sounds.  Pulmonary:     Effort: Pulmonary effort is normal. No respiratory distress.     Breath sounds: No stridor.  Skin:  General: Skin is warm and dry.     Findings: No rash.  Neurological:     Mental Status: He is alert. Mental status is at baseline.     Motor: No abnormal muscle tone.     Coordination: Coordination normal.     Gait: Gait normal.  Psychiatric:        Mood and Affect: Mood normal.        Behavior: Behavior normal.      Functional Status Survey: Is the patient deaf or have difficulty hearing?: No Does the patient have difficulty seeing, even when wearing glasses/contacts?: No Does the patient have difficulty concentrating, remembering, or making decisions?: No Does the patient have difficulty walking or climbing stairs?: No Does the patient have difficulty dressing or bathing?: No Does the patient have difficulty doing errands alone such as visiting a doctor's office or shopping?: No Results for orders placed or performed during the hospital encounter of 11/19/22  Lipase, blood   Collection Time: 11/19/22  7:14 PM  Result Value Ref Range   Lipase 30 11 - 51 U/L  Comprehensive metabolic panel   Collection Time: 11/19/22  7:14 PM  Result Value Ref Range   Sodium 134 (L) 135 - 145 mmol/L   Potassium 3.0 (L) 3.5 - 5.1 mmol/L   Chloride 98 98 - 111 mmol/L   CO2 21 (L) 22 - 32 mmol/L   Glucose, Bld 165 (H) 70 - 99 mg/dL   BUN 20 8 - 23 mg/dL   Creatinine, Ser 1.47 (H) 0.61 - 1.24 mg/dL   Calcium 9.0 8.9 - 82.9 mg/dL   Total Protein 7.8 6.5 - 8.1 g/dL   Albumin 4.4 3.5 - 5.0 g/dL   AST 21 15 - 41 U/L   ALT 19 0 - 44 U/L   Alkaline Phosphatase 63 38 - 126 U/L    Total Bilirubin 0.7 0.3 - 1.2 mg/dL   GFR, Estimated 59 (L) >60 mL/min   Anion gap 15 5 - 15  CBC   Collection Time: 11/19/22  7:14 PM  Result Value Ref Range   WBC 9.8 4.0 - 10.5 K/uL   RBC 3.95 (L) 4.22 - 5.81 MIL/uL   Hemoglobin 12.4 (L) 13.0 - 17.0 g/dL   HCT 56.2 (L) 13.0 - 86.5 %   MCV 90.6 80.0 - 100.0 fL   MCH 31.4 26.0 - 34.0 pg   MCHC 34.6 30.0 - 36.0 g/dL   RDW 78.4 69.6 - 29.5 %   Platelets 240 150 - 400 K/uL   nRBC 0.0 0.0 - 0.2 %  Urinalysis, Routine w reflex microscopic -Urine, Clean Catch   Collection Time: 11/19/22 10:20 PM  Result Value Ref Range   Color, Urine YELLOW (A) YELLOW   APPearance CLEAR (A) CLEAR   Specific Gravity, Urine 1.009 1.005 - 1.030   pH 5.0 5.0 - 8.0   Glucose, UA NEGATIVE NEGATIVE mg/dL   Hgb urine dipstick NEGATIVE NEGATIVE   Bilirubin Urine NEGATIVE NEGATIVE   Ketones, ur NEGATIVE NEGATIVE mg/dL   Protein, ur 30 (A) NEGATIVE mg/dL   Nitrite NEGATIVE NEGATIVE   Leukocytes,Ua NEGATIVE NEGATIVE   RBC / HPF 0-5 0 - 5 RBC/hpf   WBC, UA 0-5 0 - 5 WBC/hpf   Bacteria, UA NONE SEEN NONE SEEN   Squamous Epithelial / HPF 0-5 0 - 5 /HPF      Assessment & Plan:   Uncontrolled type 2 diabetes mellitus with hyperglycemia (HCC) Assessment & Plan: Hx of being uncontrolled -  last A1c was ~10 with subsequent d/c of lantus Explained to pt that we will need to obtain all his labs and then see what med adjustments would be needed - recommend this process happen with f/up OV and shared decision making  Orders: -     Hemoglobin A1c -     Lipid panel -     Comprehensive metabolic panel with GFR -     Microalbumin / creatinine urine ratio  Encounter for medical examination to establish care  Have updated and reviewed chart as able today - extensive chart review and establishing pt for care here today - more than 20 min spent on chart and history review  Req more recent PCP records - pt states last OV was more recent than 2023?   Immunization  due  Pt declines today  Due for shingles and PCV   Essential hypertension Assessment & Plan: BP near goal today on his current meds which includes cardura , diltiazem  and HCTZ 12.5 With ACEI angioedema - could consider starting ARB BP Readings from Last 3 Encounters:  09/30/23 136/82  11/20/22 (!) 172/84  02/08/22 (!) 150/86    Orders: -     Comprehensive metabolic panel with GFR  Hyperlipidemia, unspecified hyperlipidemia type Assessment & Plan: On simvastatin  Will obtain lipids and assess control with current statin  Orders: -     Lipid panel -     Comprehensive metabolic panel with GFR  Smoker Assessment & Plan: Discussed and reviewed smoking hx Smoking cessation and counseling discussed for more than 4 min today Qualifies for lung CA screening low dose CT, but he is also endorsing some sx so we will have him consult with pulmonary first 40 pack year hx  Smoking cessation instruction/counseling given:  counseled patient on the dangers of tobacco use, advised patient to stop smoking, and reviewed strategies to maximize success   Orders: -     Pulmonary Visit  Positive for microalbuminuria Assessment & Plan: If still positive consider farxiga/jardiance  Orders: -     Microalbumin / creatinine urine ratio  Encounter for medication monitoring -     Hemoglobin A1c -     Lipid panel -     CBC with Differential/Platelet -     Comprehensive metabolic panel with GFR -     Microalbumin / creatinine urine ratio  Anemia, unspecified type Assessment & Plan: Per last visible labs Recheck CBC  Orders: -     CBC with Differential/Platelet  Class 1 obesity with serious comorbidity and body mass index (BMI) of 31.0 to 31.9 in adult, unspecified obesity type Assessment & Plan: Multiple comorbidities - HLD, DM, HTN  Orders: -     Hemoglobin A1c -     Lipid panel -     CBC with Differential/Platelet -     Comprehensive metabolic panel with GFR  Gastroesophageal  reflux disease, unspecified whether esophagitis present Assessment & Plan: Reports severe GERD sx, refill on PPI Advised to reduce ETOH and avoid NSAIDs If on long term PPI with continued sx pt recommended to consult with GI  Orders: -     Omeprazole; Take 1 capsule (40 mg total) by mouth daily.  Dispense: 90 capsule; Refill: 3  Psoriasis Assessment & Plan: Pt reports hx of psoriasis, no current derm management, patches and plaques to various places - worse to hands He asks for refill on his steroids ointment  Orders: -     Betamethasone  Valerate; Apply 1 Application topically 2 (two) times  daily. To affected areas, not for use on face/genitals/groin  Dispense: 45 g; Refill: 2  Dyspnea on exertion  and pt also endorses wheeze and using wife's inhaler often With smoking hx and sx recommend pulm consult and hopefully PFTs will be ordered -     Pulmonary Visit -     Albuterol Sulfate - rescue inhaler sent in but he likely needs a daily maintenance inhaler - sig smoking hx, recurrent bronchitis, pt with wheeze and SOB/DOE   Return for 1 month f/up med check and f/up on labs - uncontrolled DM .   Adeline Hone, PA-C 09/30/23 1:36 PM

## 2023-10-01 LAB — CBC WITH DIFFERENTIAL/PLATELET
Absolute Lymphocytes: 2006 {cells}/uL (ref 850–3900)
Absolute Monocytes: 1046 {cells}/uL — ABNORMAL HIGH (ref 200–950)
Basophils Absolute: 60 {cells}/uL (ref 0–200)
Basophils Relative: 0.7 %
Eosinophils Absolute: 264 {cells}/uL (ref 15–500)
Eosinophils Relative: 3.1 %
HCT: 39.2 % (ref 38.5–50.0)
Hemoglobin: 13.2 g/dL (ref 13.2–17.1)
MCH: 32 pg (ref 27.0–33.0)
MCHC: 33.7 g/dL (ref 32.0–36.0)
MCV: 94.9 fL (ref 80.0–100.0)
MPV: 9.7 fL (ref 7.5–12.5)
Monocytes Relative: 12.3 %
Neutro Abs: 5126 {cells}/uL (ref 1500–7800)
Neutrophils Relative %: 60.3 %
Platelets: 280 10*3/uL (ref 140–400)
RBC: 4.13 10*6/uL — ABNORMAL LOW (ref 4.20–5.80)
RDW: 12.7 % (ref 11.0–15.0)
Total Lymphocyte: 23.6 %
WBC: 8.5 10*3/uL (ref 3.8–10.8)

## 2023-10-01 LAB — LIPID PANEL
Cholesterol: 180 mg/dL (ref <200)
HDL: 75 mg/dL (ref >=40)
LDL Cholesterol (Calc): 77 mg/dL
Non-HDL Cholesterol (Calc): 105 mg/dL (ref <130)
Total CHOL/HDL Ratio: 2.4 (calc) (ref <5.0)
Triglycerides: 185 mg/dL — ABNORMAL HIGH (ref <150)

## 2023-10-01 LAB — HEMOGLOBIN A1C
Hgb A1c MFr Bld: 7.8 % — ABNORMAL HIGH (ref <5.7)
Mean Plasma Glucose: 177 mg/dL
eAG (mmol/L): 9.8 mmol/L

## 2023-10-01 LAB — COMPREHENSIVE METABOLIC PANEL WITH GFR
AG Ratio: 1.6 (calc) (ref 1.0–2.5)
ALT: 21 U/L (ref 9–46)
AST: 18 U/L (ref 10–35)
Albumin: 4.6 g/dL (ref 3.6–5.1)
Alkaline phosphatase (APISO): 58 U/L (ref 35–144)
BUN: 15 mg/dL (ref 7–25)
CO2: 27 mmol/L (ref 20–32)
Calcium: 9.1 mg/dL (ref 8.6–10.3)
Chloride: 102 mmol/L (ref 98–110)
Creat: 0.97 mg/dL (ref 0.70–1.35)
Globulin: 2.8 g/dL (ref 1.9–3.7)
Glucose, Bld: 123 mg/dL — ABNORMAL HIGH (ref 65–99)
Potassium: 4 mmol/L (ref 3.5–5.3)
Sodium: 141 mmol/L (ref 135–146)
Total Bilirubin: 0.3 mg/dL (ref 0.2–1.2)
Total Protein: 7.4 g/dL (ref 6.1–8.1)
eGFR: 88 mL/min/{1.73_m2} (ref >=60)

## 2023-10-01 LAB — MICROALBUMIN / CREATININE URINE RATIO
Creatinine, Urine: 19 mg/dL — ABNORMAL LOW (ref 20–320)
Microalb Creat Ratio: 316 mg/g{creat} — ABNORMAL HIGH (ref <30)
Microalb, Ur: 6 mg/dL

## 2023-10-02 ENCOUNTER — Ambulatory Visit: Payer: Self-pay | Admitting: Family Medicine

## 2023-10-02 DIAGNOSIS — F17200 Nicotine dependence, unspecified, uncomplicated: Secondary | ICD-10-CM

## 2023-10-02 DIAGNOSIS — E1121 Type 2 diabetes mellitus with diabetic nephropathy: Secondary | ICD-10-CM

## 2023-10-02 DIAGNOSIS — R0609 Other forms of dyspnea: Secondary | ICD-10-CM

## 2023-10-02 DIAGNOSIS — E1165 Type 2 diabetes mellitus with hyperglycemia: Secondary | ICD-10-CM

## 2023-10-02 DIAGNOSIS — E785 Hyperlipidemia, unspecified: Secondary | ICD-10-CM

## 2023-10-02 DIAGNOSIS — I10 Essential (primary) hypertension: Secondary | ICD-10-CM

## 2023-10-02 DIAGNOSIS — R809 Proteinuria, unspecified: Secondary | ICD-10-CM

## 2023-10-02 DIAGNOSIS — J40 Bronchitis, not specified as acute or chronic: Secondary | ICD-10-CM

## 2023-10-02 MED ORDER — HYDROCHLOROTHIAZIDE 25 MG PO TABS: 12.5000 mg | ORAL_TABLET | Freq: Every day | ORAL | 1 refills | Status: DC

## 2023-10-02 MED ORDER — BUDESONIDE-FORMOTEROL FUMARATE 160-4.5 MCG/ACT IN AERO
2.0000 | INHALATION_SPRAY | Freq: Two times a day (BID) | RESPIRATORY_TRACT | 5 refills | Status: DC
Start: 2023-10-02 — End: 2023-10-03

## 2023-10-02 MED ORDER — METFORMIN HCL ER 750 MG PO TB24: 750.0000 mg | ORAL_TABLET | Freq: Two times a day (BID) | ORAL | 1 refills | Status: DC

## 2023-10-02 MED ORDER — DILTIAZEM HCL ER COATED BEADS 240 MG PO CP24
ORAL_CAPSULE | ORAL | 1 refills | Status: DC
Start: 2023-10-02 — End: 2023-10-03

## 2023-10-02 MED ORDER — DAPAGLIFLOZIN PROPANEDIOL 10 MG PO TABS
10.0000 mg | ORAL_TABLET | Freq: Every day | ORAL | 5 refills | Status: AC
Start: 2023-10-02 — End: ?

## 2023-10-02 MED ORDER — ALBUTEROL SULFATE HFA 108 (90 BASE) MCG/ACT IN AERS
2.0000 | INHALATION_SPRAY | Freq: Four times a day (QID) | RESPIRATORY_TRACT | 2 refills | Status: DC | PRN
Start: 2023-10-02 — End: 2023-10-03

## 2023-10-02 MED ORDER — ROSUVASTATIN CALCIUM 10 MG PO TABS
10.0000 mg | ORAL_TABLET | Freq: Every day | ORAL | 1 refills | Status: DC
Start: 2023-10-02 — End: 2023-10-03

## 2023-10-02 MED ORDER — DOXAZOSIN MESYLATE 4 MG PO TABS: 4.0000 mg | ORAL_TABLET | Freq: Every day | ORAL | 1 refills | Status: DC

## 2023-10-03 ENCOUNTER — Other Ambulatory Visit: Payer: Self-pay | Admitting: Family Medicine

## 2023-10-03 ENCOUNTER — Telehealth: Payer: Self-pay | Admitting: Family Medicine

## 2023-10-03 DIAGNOSIS — E1165 Type 2 diabetes mellitus with hyperglycemia: Secondary | ICD-10-CM

## 2023-10-03 DIAGNOSIS — R0609 Other forms of dyspnea: Secondary | ICD-10-CM

## 2023-10-03 DIAGNOSIS — F17200 Nicotine dependence, unspecified, uncomplicated: Secondary | ICD-10-CM

## 2023-10-03 DIAGNOSIS — I10 Essential (primary) hypertension: Secondary | ICD-10-CM

## 2023-10-03 DIAGNOSIS — J40 Bronchitis, not specified as acute or chronic: Secondary | ICD-10-CM

## 2023-10-03 DIAGNOSIS — E785 Hyperlipidemia, unspecified: Secondary | ICD-10-CM

## 2023-10-03 NOTE — Telephone Encounter (Signed)
 Copied from CRM [id number]. Topic: Clinical - Medication Question >> Oct 03, 2023  8:34 AM Marissa P wrote: Reason for CRM: Wife called wanting to know about the inhaler if patient will be receiving as well, please advise

## 2023-10-03 NOTE — Telephone Encounter (Unsigned)
 Copied from CRM [id number]. Topic: Clinical - Medication Refill >> Oct 03, 2023  8:29 AM Marissa P wrote: Medication: metFORMIN  (GLUCOPHAGE -XR) 750 MG 24 hr tablet,  Simvastatin  40mg  tablet and hydrochlorothiazide  (HYDRODIURIL ) 25 MG tablet   Has the patient contacted their pharmacy? Yes (Agent: If no, request that the patient contact the pharmacy for the refill. If patient does not wish to contact the pharmacy document the reason why and proceed with request.) (Agent: If yes, when and what did the pharmacy advise?)  This is the patient's preferred pharmacy:   [facility] - [place], [place] - [address] [place] [address] Phone: [phone] Fax: [phone]  Is this the correct pharmacy for this prescription? Yes If no, delete pharmacy and type the correct one.   Has the prescription been filled recently? Yes  Is the patient out of the medication? Yes  Has the patient been seen for an appointment in the last year OR does the patient have an upcoming appointment? Yes  Can we respond through MyChart? No  Agent: Please be advised that Rx refills may take up to 3 business days. We ask that you follow-up with your pharmacy.

## 2023-10-03 NOTE — Telephone Encounter (Signed)
 Copied from CRM [id number]. Topic: Clinical - Prescription Issue >> Oct 03, 2023  3:55 PM [name] F wrote: Reason for CRM: Patient is calling in because patient's medications need to be sent to [facility] not [facility]. albuterol (VENTOLIN HFA) 108 (90 Base) MCG/ACT inhaler [[id number]], budesonide-formoterol (SYMBICORT) 160-4.5 MCG/ACT inhaler [[id number]], dapagliflozin propanediol (FARXIGA) 10 MG TABS tablet [[id number]], diltiazem  (CARDIZEM  CD) 240 MG 24 hr capsule [[id number]], doxazosin  (CARDURA ) 4 MG tablet [[id number]], hydrochlorothiazide  (HYDRODIURIL ) 25 MG tablet [[id number]], metFORMIN  (GLUCOPHAGE -XR) 750 MG 24 hr tablet [[id number]], rosuvastatin (CRESTOR) 10 MG tablet [[id number]], Patient needs this sent over as soon as possible because he will run out over the weekend.

## 2023-10-07 MED ORDER — METFORMIN HCL ER 750 MG PO TB24: 750.0000 mg | ORAL_TABLET | Freq: Two times a day (BID) | ORAL | 1 refills | Status: DC

## 2023-10-07 MED ORDER — ROSUVASTATIN CALCIUM 10 MG PO TABS
10.0000 mg | ORAL_TABLET | Freq: Every day | ORAL | 1 refills | Status: DC
Start: 2023-10-07 — End: 2024-04-01

## 2023-10-07 MED ORDER — HYDROCHLOROTHIAZIDE 25 MG PO TABS
12.5000 mg | ORAL_TABLET | Freq: Every day | ORAL | 1 refills | Status: DC
Start: 2023-10-07 — End: 2024-03-18

## 2023-10-07 MED ORDER — ALBUTEROL SULFATE HFA 108 (90 BASE) MCG/ACT IN AERS: 2.0000 | INHALATION_SPRAY | Freq: Four times a day (QID) | RESPIRATORY_TRACT | 2 refills | Status: DC | PRN

## 2023-10-07 MED ORDER — DILTIAZEM HCL ER COATED BEADS 240 MG PO CP24: ORAL_CAPSULE | ORAL | 1 refills | Status: DC

## 2023-10-07 MED ORDER — BUDESONIDE-FORMOTEROL FUMARATE 160-4.5 MCG/ACT IN AERO: 2.0000 | INHALATION_SPRAY | Freq: Two times a day (BID) | RESPIRATORY_TRACT | 5 refills | Status: DC

## 2023-10-07 NOTE — Telephone Encounter (Signed)
 Requested medications are due for refill today.  No - pt wants rx's sent to a different pharmacy. Stain has been changed.  Requested medications are on the active medications list.  yes  Last refill. 10/02/2023  Future visit scheduled.     Notes to clinic.  No - pt wants rx's sent to a different pharmacy. Stain has been changed.    Requested Prescriptions  Pending Prescriptions Disp Refills   metFORMIN  (GLUCOPHAGE -XR) 750 MG 24 hr tablet 180 tablet 1    Sig: Take 1 tablet (750 mg total) by mouth 2 (two) times daily with a meal.     Endocrinology:  Diabetes - Biguanides Failed - 10/07/2023 11:41 AM      Failed - B12 Level in normal range and within 720 days    No results found for: "VITAMINB12"       Failed - Valid encounter within last 6 months    Recent Outpatient Visits           1 week ago Uncontrolled type 2 diabetes mellitus with hyperglycemia [facility])   [facility] [place], Jennie Moeller, PA-C              Failed - CBC within normal limits and completed in the last 12 months    WBC  Date Value Ref Range Status  09/30/2023 8.5 3.8 - 10.8 Thousand/uL Final   RBC  Date Value Ref Range Status  09/30/2023 4.13 (L) 4.20 - 5.80 Million/uL Final   Hemoglobin  Date Value Ref Range Status  09/30/2023 13.2 13.2 - 17.1 g/dL Final  16/02/9603 54.0 13.0 - 17.7 g/dL Final   HCT  Date Value Ref Range Status  09/30/2023 39.2 38.5 - 50.0 % Final   Hematocrit  Date Value Ref Range Status  06/08/2020 44.8 37.5 - 51.0 % Final   MCHC  Date Value Ref Range Status  09/30/2023 33.7 32.0 - 36.0 g/dL Final    Comment:    For adults, a slight decrease in the calculated MCHC value (in the range of 30 to 32 g/dL) is most likely not clinically significant; however, it should be interpreted with caution in correlation with other red cell parameters and the patient's clinical condition.    [facility]  Date Value Ref Range Status  09/30/2023 32.0 27.0 - 33.0 pg Final    MCV  Date Value Ref Range Status  09/30/2023 94.9 80.0 - 100.0 fL Final  06/08/2020 90 79 - 97 fL Final  03/01/2013 92 80 - 100 fL Final   No results found for: "PLTCOUNTKUC", "LABPLAT", "POCPLA" RDW  Date Value Ref Range Status  09/30/2023 12.7 11.0 - 15.0 % Final  06/08/2020 13.3 11.6 - 15.4 % Final  03/01/2013 13.1 11.5 - 14.5 % Final         Passed - Cr in normal range and within 360 days    Creat  Date Value Ref Range Status  09/30/2023 0.97 0.70 - 1.35 mg/dL Final   Creatinine, Urine  Date Value Ref Range Status  09/30/2023 19 (L) 20 - 320 mg/dL Final         Passed - HBA1C is between 0 and 7.9 and within 180 days    Hgb A1c MFr Bld  Date Value Ref Range Status  09/30/2023 7.8 (H) <5.7 % Final    Comment:    For someone without known diabetes, a hemoglobin A1c value of 6.5% or greater indicates that they may have  diabetes and this should be confirmed  with a follow-up  test. . For someone with known diabetes, a value <7% indicates  that their diabetes is well controlled and a value  greater than or equal to 7% indicates suboptimal  control. A1c targets should be individualized based on  duration of diabetes, age, comorbid conditions, and  other considerations. . Currently, no consensus exists regarding use of hemoglobin A1c for diagnosis of diabetes for children. .          Passed - eGFR in normal range and within 360 days    EGFR (African American)  Date Value Ref Range Status  03/01/2013 >60  Final   GFR calc Af Amer  Date Value Ref Range Status  06/08/2020 105 >59 mL/min/1.73 Final    Comment:    **In accordance with recommendations from the NKF-ASN Task force,**   [facility] is in the process of updating its eGFR calculation to the   2021 CKD-EPI creatinine equation that estimates kidney function   without a race variable.    EGFR (Non-African Amer.)  Date Value Ref Range Status  03/01/2013 >60  Final    Comment:    eGFR values  <32mL/min/1.73 m2 may be an indication of chronic kidney disease (CKD). Calculated eGFR is useful in patients with stable renal function. The eGFR calculation will not be reliable in acutely ill patients when serum creatinine is changing rapidly. It is not useful in  patients on dialysis. The eGFR calculation may not be applicable to patients at the low and high extremes of body sizes, pregnant women, and vegetarians.    GFR, Estimated  Date Value Ref Range Status  11/19/2022 59 (L) >60 mL/min Final    Comment:    (NOTE) Calculated using the CKD-EPI Creatinine Equation (2021)    eGFR  Date Value Ref Range Status  09/30/2023 88 > OR = 60 mL/min/1.41m2 Final          simvastatin  (ZOCOR ) 20 MG tablet 30 tablet 6    Sig: Take 1 tablet (20 mg total) by mouth daily.     Cardiovascular:  Antilipid - Statins Failed - 10/07/2023 11:41 AM      Failed - Valid encounter within last 12 months    Recent Outpatient Visits           1 week ago Uncontrolled type 2 diabetes mellitus with hyperglycemia [facility])   [place] [facility] [place], Leisa, PA-C              Failed - Lipid Panel in normal range within the last 12 months    Cholesterol, Total  Date Value Ref Range Status  06/08/2020 212 (H) 100 - 199 mg/dL Final   Cholesterol  Date Value Ref Range Status  09/30/2023 180 <200 mg/dL Final   LDL Cholesterol (Calc)  Date Value Ref Range Status  09/30/2023 77 mg/dL (calc) Final    Comment:    Reference range: <100 . Desirable range <100 mg/dL for primary prevention;   <70 mg/dL for patients with CHD or diabetic patients  with > or = 2 CHD risk factors. Aaron Aas LDL-C is now calculated using the Martin-Hopkins  calculation, which is a validated novel method providing  better accuracy than the Friedewald equation in the  estimation of LDL-C.  Melinda Sprawls et al. Erroll Heard. 1610;960(45): 2061-2068  (http://education.[facility].com/faq/FAQ164)    HDL  Date Value  Ref Range Status  09/30/2023 75 > OR = 40 mg/dL Final  40/98/1191 81 >47 mg/dL Final   Triglycerides  Date Value  Ref Range Status  09/30/2023 185 (H) <150 mg/dL Final         Passed - Patient is not pregnant       hydrochlorothiazide  (HYDRODIURIL ) 25 MG tablet 45 tablet 1    Sig: Take 0.5 tablets (12.5 mg total) by mouth daily.     Cardiovascular: Diuretics - Thiazide Failed - 10/07/2023 11:41 AM      Failed - Valid encounter within last 6 months    Recent Outpatient Visits           1 week ago Uncontrolled type 2 diabetes mellitus with hyperglycemia [facility])   [facility] [place], Jennie Moeller, PA-C              Passed - Cr in normal range and within 180 days    Creat  Date Value Ref Range Status  09/30/2023 0.97 0.70 - 1.35 mg/dL Final   Creatinine, Urine  Date Value Ref Range Status  09/30/2023 19 (L) 20 - 320 mg/dL Final         Passed - K in normal range and within 180 days    Potassium  Date Value Ref Range Status  09/30/2023 4.0 3.5 - 5.3 mmol/L Final  03/01/2013 4.2 3.5 - 5.1 mmol/L Final         Passed - Na in normal range and within 180 days    Sodium  Date Value Ref Range Status  09/30/2023 141 135 - 146 mmol/L Final  06/08/2020 141 134 - 144 mmol/L Final  03/01/2013 139 136 - 145 mmol/L Final         Passed - Last BP in normal range    BP Readings from Last 1 Encounters:  09/30/23 136/82

## 2023-10-07 NOTE — Telephone Encounter (Signed)
 Change in pharmacy- Rx trnasferred Requested Prescriptions  Pending Prescriptions Disp Refills   albuterol (VENTOLIN HFA) 108 (90 Base) MCG/ACT inhaler 18 g 2    Sig: Inhale 2 puffs into the lungs every 6 (six) hours as needed for wheezing or shortness of breath.     Pulmonology:  Beta Agonists 2 Failed - 10/07/2023  3:06 PM      Failed - Valid encounter within last 12 months    Recent Outpatient Visits           1 week ago Uncontrolled type 2 diabetes mellitus with hyperglycemia [facility])   [facility] [place], Jennie Moeller, PA-C              Passed - Last BP in normal range    BP Readings from Last 1 Encounters:  09/30/23 136/82         Passed - Last Heart Rate in normal range    Pulse Readings from Last 1 Encounters:  09/30/23 91          budesonide-formoterol (SYMBICORT) 160-4.5 MCG/ACT inhaler 10.2 each 5    Sig: Inhale 2 puffs into the lungs 2 (two) times daily. Rinse out mouth after each use     Pulmonology:  Combination Products Failed - 10/07/2023  3:06 PM      Failed - Valid encounter within last 12 months    Recent Outpatient Visits           1 week ago Uncontrolled type 2 diabetes mellitus with hyperglycemia [facility])   [place] [facility] Adeline Hone, PA-C               diltiazem  (CARDIZEM  CD) 240 MG 24 hr capsule 90 capsule 1    Sig: TAKE 1 CAPSULE BY MOUTH ONCE DAILY     Cardiovascular: Calcium Channel Blockers 3 Failed - 10/07/2023  3:06 PM      Failed - Valid encounter within last 6 months    Recent Outpatient Visits           1 week ago Uncontrolled type 2 diabetes mellitus with hyperglycemia [facility])    [facility] [place], Jennie Moeller, PA-C              Passed - ALT in normal range and within 360 days    ALT  Date Value Ref Range Status  09/30/2023 21 9 - 46 U/L Final         Passed - AST in normal range and within 360 days    AST  Date Value Ref Range Status  09/30/2023 18 10 - 35  U/L Final         Passed - Cr in normal range and within 360 days    Creat  Date Value Ref Range Status  09/30/2023 0.97 0.70 - 1.35 mg/dL Final   Creatinine, Urine  Date Value Ref Range Status  09/30/2023 19 (L) 20 - 320 mg/dL Final         Passed - Last BP in normal range    BP Readings from Last 1 Encounters:  09/30/23 136/82         Passed - Last Heart Rate in normal range    Pulse Readings from Last 1 Encounters:  09/30/23 91          hydrochlorothiazide  (HYDRODIURIL ) 25 MG tablet 45 tablet 1    Sig: Take 0.5 tablets (12.5 mg total) by mouth daily.     Cardiovascular: Diuretics - Thiazide  Failed - 10/07/2023  3:06 PM      Failed - Valid encounter within last 6 months    Recent Outpatient Visits           1 week ago Uncontrolled type 2 diabetes mellitus with hyperglycemia [facility])   [facility] [place], Jennie Moeller, PA-C              Passed - Cr in normal range and within 180 days    Creat  Date Value Ref Range Status  09/30/2023 0.97 0.70 - 1.35 mg/dL Final   Creatinine, Urine  Date Value Ref Range Status  09/30/2023 19 (L) 20 - 320 mg/dL Final         Passed - K in normal range and within 180 days    Potassium  Date Value Ref Range Status  09/30/2023 4.0 3.5 - 5.3 mmol/L Final  03/01/2013 4.2 3.5 - 5.1 mmol/L Final         Passed - Na in normal range and within 180 days    Sodium  Date Value Ref Range Status  09/30/2023 141 135 - 146 mmol/L Final  06/08/2020 141 134 - 144 mmol/L Final  03/01/2013 139 136 - 145 mmol/L Final         Passed - Last BP in normal range    BP Readings from Last 1 Encounters:  09/30/23 136/82          metFORMIN  (GLUCOPHAGE -XR) 750 MG 24 hr tablet 180 tablet 1    Sig: Take 1 tablet (750 mg total) by mouth 2 (two) times daily with a meal.     Endocrinology:  Diabetes - Biguanides Failed - 10/07/2023  3:06 PM      Failed - B12 Level in normal range and within 720 days    No results found for:  "VITAMINB12"       Failed - Valid encounter within last 6 months    Recent Outpatient Visits           1 week ago Uncontrolled type 2 diabetes mellitus with hyperglycemia [facility])   [facility] [place], Jennie Moeller, PA-C              Failed - CBC within normal limits and completed in the last 12 months    WBC  Date Value Ref Range Status  09/30/2023 8.5 3.8 - 10.8 Thousand/uL Final   RBC  Date Value Ref Range Status  09/30/2023 4.13 (L) 4.20 - 5.80 Million/uL Final   Hemoglobin  Date Value Ref Range Status  09/30/2023 13.2 13.2 - 17.1 g/dL Final  16/02/9603 54.0 13.0 - 17.7 g/dL Final   HCT  Date Value Ref Range Status  09/30/2023 39.2 38.5 - 50.0 % Final   Hematocrit  Date Value Ref Range Status  06/08/2020 44.8 37.5 - 51.0 % Final   MCHC  Date Value Ref Range Status  09/30/2023 33.7 32.0 - 36.0 g/dL Final    Comment:    For adults, a slight decrease in the calculated MCHC value (in the range of 30 to 32 g/dL) is most likely not clinically significant; however, it should be interpreted with caution in correlation with other red cell parameters and the patient's clinical condition.    [facility]  Date Value Ref Range Status  09/30/2023 32.0 27.0 - 33.0 pg Final   MCV  Date Value Ref Range Status  09/30/2023 94.9 80.0 - 100.0 fL Final  06/08/2020 90 79 - 97 fL Final  03/01/2013 92 80 - 100 fL Final   No results found for: "PLTCOUNTKUC", "LABPLAT", "POCPLA" RDW  Date Value Ref Range Status  09/30/2023 12.7 11.0 - 15.0 % Final  06/08/2020 13.3 11.6 - 15.4 % Final  03/01/2013 13.1 11.5 - 14.5 % Final         Passed - Cr in normal range and within 360 days    Creat  Date Value Ref Range Status  09/30/2023 0.97 0.70 - 1.35 mg/dL Final   Creatinine, Urine  Date Value Ref Range Status  09/30/2023 19 (L) 20 - 320 mg/dL Final         Passed - HBA1C is between 0 and 7.9 and within 180 days    Hgb A1c MFr Bld  Date Value Ref Range Status   09/30/2023 7.8 (H) <5.7 % Final    Comment:    For someone without known diabetes, a hemoglobin A1c value of 6.5% or greater indicates that they may have  diabetes and this should be confirmed with a follow-up  test. . For someone with known diabetes, a value <7% indicates  that their diabetes is well controlled and a value  greater than or equal to 7% indicates suboptimal  control. A1c targets should be individualized based on  duration of diabetes, age, comorbid conditions, and  other considerations. . Currently, no consensus exists regarding use of hemoglobin A1c for diagnosis of diabetes for children. .          Passed - eGFR in normal range and within 360 days    EGFR (African American)  Date Value Ref Range Status  03/01/2013 >60  Final   GFR calc Af Amer  Date Value Ref Range Status  06/08/2020 105 >59 mL/min/1.73 Final    Comment:    **In accordance with recommendations from the NKF-ASN Task force,**   [facility] is in the process of updating its eGFR calculation to the   2021 CKD-EPI creatinine equation that estimates kidney function   without a race variable.    EGFR (Non-African Amer.)  Date Value Ref Range Status  03/01/2013 >60  Final    Comment:    eGFR values <64mL/min/1.73 m2 may be an indication of chronic kidney disease (CKD). Calculated eGFR is useful in patients with stable renal function. The eGFR calculation will not be reliable in acutely ill patients when serum creatinine is changing rapidly. It is not useful in  patients on dialysis. The eGFR calculation may not be applicable to patients at the low and high extremes of body sizes, pregnant women, and vegetarians.    GFR, Estimated  Date Value Ref Range Status  11/19/2022 59 (L) >60 mL/min Final    Comment:    (NOTE) Calculated using the CKD-EPI Creatinine Equation (2021)    eGFR  Date Value Ref Range Status  09/30/2023 88 > OR = 60 mL/min/1.44m2 Final          rosuvastatin   (CRESTOR ) 10 MG tablet 90 tablet 1    Sig: Take 1 tablet (10 mg total) by mouth daily.     Cardiovascular:  Antilipid - Statins 2 Failed - 10/07/2023  3:06 PM      Failed - Valid encounter within last 12 months    Recent Outpatient Visits           1 week ago Uncontrolled type 2 diabetes mellitus with hyperglycemia [facility])   [facility] Adeline Hone, PA-C  Failed - Lipid Panel in normal range within the last 12 months    Cholesterol, Total  Date Value Ref Range Status  06/08/2020 212 (H) 100 - 199 mg/dL Final   Cholesterol  Date Value Ref Range Status  09/30/2023 180 <200 mg/dL Final   LDL Cholesterol (Calc)  Date Value Ref Range Status  09/30/2023 77 mg/dL (calc) Final    Comment:    Reference range: <100 . Desirable range <100 mg/dL for primary prevention;   <70 mg/dL for patients with CHD or diabetic patients  with > or = 2 CHD risk factors. Aaron Aas LDL-C is now calculated using the Martin-Hopkins  calculation, which is a validated novel method providing  better accuracy than the Friedewald equation in the  estimation of LDL-C.  Melinda Sprawls et al. Erroll Heard. 1610;960(45): 2061-2068  (http://education.[facility].com/faq/FAQ164)    HDL  Date Value Ref Range Status  09/30/2023 75 > OR = 40 mg/dL Final  40/98/1191 81 >47 mg/dL Final   Triglycerides  Date Value Ref Range Status  09/30/2023 185 (H) <150 mg/dL Final         Passed - Cr in normal range and within 360 days    Creat  Date Value Ref Range Status  09/30/2023 0.97 0.70 - 1.35 mg/dL Final   Creatinine, Urine  Date Value Ref Range Status  09/30/2023 19 (L) 20 - 320 mg/dL Final         Passed - Patient is not pregnant      Refused Prescriptions Disp Refills   simvastatin  (ZOCOR ) 40 MG tablet [Pharmacy Med Name: SIMVASTATIN  40 MG TAB] 30 tablet     Sig: TAKE ONE TABLET BY MOUTH ONCE DAILY IN THE EVENING     Cardiovascular:  Antilipid - Statins Failed - 10/07/2023   3:06 PM      Failed - Valid encounter within last 12 months    Recent Outpatient Visits           1 week ago Uncontrolled type 2 diabetes mellitus with hyperglycemia [facility])   [place] [facility] [place], Jennie Moeller, PA-C              Failed - Lipid Panel in normal range within the last 12 months    Cholesterol, Total  Date Value Ref Range Status  06/08/2020 212 (H) 100 - 199 mg/dL Final   Cholesterol  Date Value Ref Range Status  09/30/2023 180 <200 mg/dL Final   LDL Cholesterol (Calc)  Date Value Ref Range Status  09/30/2023 77 mg/dL (calc) Final    Comment:    Reference range: <100 . Desirable range <100 mg/dL for primary prevention;   <70 mg/dL for patients with CHD or diabetic patients  with > or = 2 CHD risk factors. Aaron Aas LDL-C is now calculated using the Martin-Hopkins  calculation, which is a validated novel method providing  better accuracy than the Friedewald equation in the  estimation of LDL-C.  Melinda Sprawls et al. Erroll Heard. 8295;621(30): 2061-2068  (http://education.[facility].com/faq/FAQ164)    HDL  Date Value Ref Range Status  09/30/2023 75 > OR = 40 mg/dL Final  86/57/8469 81 >62 mg/dL Final   Triglycerides  Date Value Ref Range Status  09/30/2023 185 (H) <150 mg/dL Final         Passed - Patient is not pregnant       hydrochlorothiazide  (HYDRODIURIL ) 12.5 MG tablet [Pharmacy Med Name: HYDROCHLOROTHIAZIDE  12.5 MG TAB] 30 tablet     Sig: TAKE ONE TABLET BY MOUTH ONCE DAILY  Cardiovascular: Diuretics - Thiazide Failed - 10/07/2023  3:06 PM      Failed - Valid encounter within last 6 months    Recent Outpatient Visits           1 week ago Uncontrolled type 2 diabetes mellitus with hyperglycemia [facility])   [place] [facility] [place], Jennie Moeller, PA-C              Passed - Cr in normal range and within 180 days    Creat  Date Value Ref Range Status  09/30/2023 0.97 0.70 - 1.35 mg/dL Final   Creatinine, Urine   Date Value Ref Range Status  09/30/2023 19 (L) 20 - 320 mg/dL Final         Passed - K in normal range and within 180 days    Potassium  Date Value Ref Range Status  09/30/2023 4.0 3.5 - 5.3 mmol/L Final  03/01/2013 4.2 3.5 - 5.1 mmol/L Final         Passed - Na in normal range and within 180 days    Sodium  Date Value Ref Range Status  09/30/2023 141 135 - 146 mmol/L Final  06/08/2020 141 134 - 144 mmol/L Final  03/01/2013 139 136 - 145 mmol/L Final         Passed - Last BP in normal range    BP Readings from Last 1 Encounters:  09/30/23 136/82

## 2023-10-08 ENCOUNTER — Encounter: Payer: Self-pay | Admitting: Family Medicine

## 2023-10-08 DIAGNOSIS — L409 Psoriasis, unspecified: Secondary | ICD-10-CM | POA: Insufficient documentation

## 2023-10-08 DIAGNOSIS — K219 Gastro-esophageal reflux disease without esophagitis: Secondary | ICD-10-CM | POA: Insufficient documentation

## 2023-10-08 NOTE — Assessment & Plan Note (Signed)
 BP near goal today on his current meds which includes cardura , diltiazem  and HCTZ 12.5 With ACEI angioedema - could consider starting ARB BP Readings from Last 3 Encounters:  09/30/23 136/82  11/20/22 (!) 172/84  02/08/22 (!) 150/86

## 2023-10-08 NOTE — Assessment & Plan Note (Signed)
 On simvastatin  Will obtain lipids and assess control with current statin

## 2023-10-08 NOTE — Assessment & Plan Note (Signed)
 Multiple comorbidities - HLD, DM, HTN

## 2023-10-08 NOTE — Assessment & Plan Note (Signed)
 If still positive consider farxiga/jardiance

## 2023-10-08 NOTE — Assessment & Plan Note (Signed)
 Discussed and reviewed smoking hx Smoking cessation and counseling discussed for more than 4 min today Qualifies for lung CA screening low dose CT, but he is also endorsing some sx so we will have him consult with pulmonary first 40 pack year hx

## 2023-10-08 NOTE — Assessment & Plan Note (Signed)
 Hx of being uncontrolled - last A1c was ~10 with subsequent d/c of lantus Explained to pt that we will need to obtain all his labs and then see what med adjustments would be needed - recommend this process happen with f/up OV and shared decision making

## 2023-10-08 NOTE — Assessment & Plan Note (Signed)
 Reports severe GERD sx, refill on PPI Advised to reduce ETOH and avoid NSAIDs If on long term PPI with continued sx pt recommended to consult with GI

## 2023-10-08 NOTE — Assessment & Plan Note (Signed)
 Pt reports hx of psoriasis, no current derm management, patches and plaques to various places - worse to hands He asks for refill on his steroids ointment

## 2023-10-08 NOTE — Assessment & Plan Note (Signed)
 Per last visible labs Recheck CBC

## 2023-11-05 ENCOUNTER — Ambulatory Visit: Admitting: Family Medicine

## 2024-01-31 ENCOUNTER — Other Ambulatory Visit: Payer: Self-pay | Admitting: Family Medicine

## 2024-01-31 DIAGNOSIS — I10 Essential (primary) hypertension: Secondary | ICD-10-CM

## 2024-02-02 NOTE — Telephone Encounter (Signed)
 Rx 10/07/23 #45 1RF-6 month Rx- too soon Requested Prescriptions  Pending Prescriptions Disp Refills   hydrochlorothiazide  (HYDRODIURIL ) 25 MG tablet [Pharmacy Med Name: HYDROCHLOROTHIAZIDE  25 MG TAB] 45 tablet 1    Sig: TAKE ONE-HALF (1/2) TABLET (12.5 MG) BY MOUTH ONCE DAILY     Cardiovascular: Diuretics - Thiazide Passed - 02/02/2024  2:06 PM      Passed - Cr in normal range and within 180 days    Creat  Date Value Ref Range Status  09/30/2023 0.97 0.70 - 1.35 mg/dL Final   Creatinine, Urine  Date Value Ref Range Status  09/30/2023 19 (L) 20 - 320 mg/dL Final         Passed - K in normal range and within 180 days    Potassium  Date Value Ref Range Status  09/30/2023 4.0 3.5 - 5.3 mmol/L Final  03/01/2013 4.2 3.5 - 5.1 mmol/L Final         Passed - Na in normal range and within 180 days    Sodium  Date Value Ref Range Status  09/30/2023 141 135 - 146 mmol/L Final  06/08/2020 141 134 - 144 mmol/L Final  03/01/2013 139 136 - 145 mmol/L Final         Passed - Last BP in normal range    BP Readings from Last 1 Encounters:  09/30/23 136/82         Passed - Valid encounter within last 6 months    Recent Outpatient Visits           4 months ago Uncontrolled type 2 diabetes mellitus with hyperglycemia [facility])   [facility] Leavy Mole, PA-C

## 2024-03-15 DIAGNOSIS — H353190 Nonexudative age-related macular degeneration, unspecified eye, stage unspecified: Secondary | ICD-10-CM | POA: Diagnosis not present

## 2024-03-15 DIAGNOSIS — H353212 Exudative age-related macular degeneration, right eye, with inactive choroidal neovascularization: Secondary | ICD-10-CM | POA: Diagnosis not present

## 2024-03-15 DIAGNOSIS — Z961 Presence of intraocular lens: Secondary | ICD-10-CM | POA: Diagnosis not present

## 2024-03-15 DIAGNOSIS — H353112 Nonexudative age-related macular degeneration, right eye, intermediate dry stage: Secondary | ICD-10-CM | POA: Diagnosis not present

## 2024-03-15 LAB — OPHTHALMOLOGY REPORT-SCANNED

## 2024-03-17 ENCOUNTER — Other Ambulatory Visit: Payer: Self-pay | Admitting: Family Medicine

## 2024-03-17 DIAGNOSIS — I10 Essential (primary) hypertension: Secondary | ICD-10-CM

## 2024-03-18 NOTE — Telephone Encounter (Signed)
 Requested Prescriptions  Pending Prescriptions Disp Refills   hydrochlorothiazide  (HYDRODIURIL ) 25 MG tablet [Pharmacy Med Name: HYDROCHLOROTHIAZIDE  25 MG TAB] 45 tablet 0    Sig: TAKE ONE-HALF (1/2) TABLET (12.5 MG) BY MOUTH ONCE DAILY     Cardiovascular: Diuretics - Thiazide Passed - 03/18/2024  4:29 PM      Passed - Cr in normal range and within 180 days    Creat  Date Value Ref Range Status  09/30/2023 0.97 0.70 - 1.35 mg/dL Final   Creatinine, Urine  Date Value Ref Range Status  09/30/2023 19 (L) 20 - 320 mg/dL Final         Passed - K in normal range and within 180 days    Potassium  Date Value Ref Range Status  09/30/2023 4.0 3.5 - 5.3 mmol/L Final  03/01/2013 4.2 3.5 - 5.1 mmol/L Final         Passed - Na in normal range and within 180 days    Sodium  Date Value Ref Range Status  09/30/2023 141 135 - 146 mmol/L Final  06/08/2020 141 134 - 144 mmol/L Final  03/01/2013 139 136 - 145 mmol/L Final         Passed - Last BP in normal range    BP Readings from Last 1 Encounters:  09/30/23 136/82         Passed - Valid encounter within last 6 months    Recent Outpatient Visits           5 months ago Uncontrolled type 2 diabetes mellitus with hyperglycemia [facility])   [facility] Leavy Mole, PA-C

## 2024-03-26 ENCOUNTER — Telehealth: Payer: Self-pay | Admitting: Family Medicine

## 2024-03-26 NOTE — Telephone Encounter (Signed)
 Copied from CRM [id number]. Topic: Clinical - Prescription Issue >> Mar 26, 2024  3:29 PM Avram MATSU wrote: Reason for CRM: wife is calling about her husband medications, a list of it was approved but nothing was sent to the pharmacy. Has been out his medication for a little over a week.   [facility] - [place], [place] - [address] [place] [address] Phone: [phone] Fax: [phone]

## 2024-03-31 ENCOUNTER — Other Ambulatory Visit: Payer: Self-pay | Admitting: Family Medicine

## 2024-03-31 DIAGNOSIS — E785 Hyperlipidemia, unspecified: Secondary | ICD-10-CM

## 2024-03-31 DIAGNOSIS — I10 Essential (primary) hypertension: Secondary | ICD-10-CM

## 2024-03-31 NOTE — Telephone Encounter (Signed)
 Copied from CRM #[id number]. Topic: Clinical - Medication Refill >> Mar 31, 2024  4:21 PM Lauren C wrote: Medication:  doxazosin  (CARDURA ) 4 MG tablet rosuvastatin  (CRESTOR ) 10 MG tablet  Has the patient contacted their pharmacy? No  This is the patient's preferred pharmacy:   [facility] - [place], [place] - [address] [place] [address] Phone: [phone] Fax: [phone]  Is this the correct pharmacy for this prescription? Yes If no, delete pharmacy and type the correct one.   Has the prescription been filled recently? Yes  Is the patient out of the medication? Yes  Has the patient been seen for an appointment in the last year OR does the patient have an upcoming appointment? Yes  Can we respond through MyChart? Please call either   Agent: Please be advised that Rx refills may take up to 3 business days. We ask that you follow-up with your pharmacy.

## 2024-04-02 MED ORDER — DOXAZOSIN MESYLATE 4 MG PO TABS: 4.0000 mg | ORAL_TABLET | Freq: Every day | ORAL | 0 refills | Status: DC

## 2024-04-02 MED ORDER — ROSUVASTATIN CALCIUM 10 MG PO TABS: 10.0000 mg | ORAL_TABLET | Freq: Every day | ORAL | 0 refills | Status: DC

## 2024-04-02 NOTE — Telephone Encounter (Signed)
 Requested Prescriptions  Pending Prescriptions Disp Refills   doxazosin  (CARDURA ) 4 MG tablet 90 tablet 0    Sig: Take 1 tablet (4 mg total) by mouth daily.     Cardiovascular:  Alpha Blockers Failed - 04/02/2024  4:10 PM      Failed - Valid encounter within last 6 months    Recent Outpatient Visits           6 months ago Uncontrolled type 2 diabetes mellitus with hyperglycemia [facility])   [facility] [place], Michelene, PA-C              Passed - Last BP in normal range    BP Readings from Last 1 Encounters:  09/30/23 136/82          rosuvastatin  (CRESTOR ) 10 MG tablet 90 tablet 0    Sig: Take 1 tablet (10 mg total) by mouth daily.     Cardiovascular:  Antilipid - Statins 2 Failed - 04/02/2024  4:10 PM      Failed - Lipid Panel in normal range within the last 12 months    Cholesterol, Total  Date Value Ref Range Status  06/08/2020 212 (H) 100 - 199 mg/dL Final   Cholesterol  Date Value Ref Range Status  09/30/2023 180 <200 mg/dL Final   LDL Cholesterol (Calc)  Date Value Ref Range Status  09/30/2023 77 mg/dL (calc) Final    Comment:    Reference range: <100 . Desirable range <100 mg/dL for primary prevention;   <70 mg/dL for patients with CHD or diabetic patients  with > or = 2 CHD risk factors. SABRA LDL-C is now calculated using the Martin-Hopkins  calculation, which is a validated novel method providing  better accuracy than the Friedewald equation in the  estimation of LDL-C.  Gladis APPLETHWAITE et al. SANDREA. 7986;689(80): 2061-2068  (http://education.[employer].com/faq/FAQ164)    HDL  Date Value Ref Range Status  09/30/2023 75 > OR = 40 mg/dL Final  98/72/7977 81 >60 mg/dL Final   Triglycerides  Date Value Ref Range Status  09/30/2023 185 (H) <150 mg/dL Final         Passed - Cr in normal range and within 360 days    Creat  Date Value Ref Range Status  09/30/2023 0.97 0.70 - 1.35 mg/dL Final   Creatinine, Urine  Date Value Ref  Range Status  09/30/2023 19 (L) 20 - 320 mg/dL Final         Passed - Patient is not pregnant      Passed - Valid encounter within last 12 months    Recent Outpatient Visits           6 months ago Uncontrolled type 2 diabetes mellitus with hyperglycemia [facility])   [facility] [place] Michelene, PA-C

## 2024-04-20 ENCOUNTER — Telehealth: Payer: Self-pay | Admitting: Family Medicine

## 2024-04-20 NOTE — Telephone Encounter (Signed)
 Appt scheduled, will do refills then

## 2024-04-20 NOTE — Telephone Encounter (Signed)
omeprazole (PRILOSEC) 40 MG capsule ?

## 2024-04-22 ENCOUNTER — Ambulatory Visit: Admitting: Family Medicine

## 2024-04-29 ENCOUNTER — Ambulatory Visit: Admitting: Family Medicine

## 2024-04-29 ENCOUNTER — Ambulatory Visit: Payer: Self-pay

## 2024-04-29 NOTE — Telephone Encounter (Signed)
 FYI Only or Action Required?: Action required by provider: request for appointment.current appt listed needs to be transfer of care  Patient was last seen in primary care on 09/30/2023 by Tapia, Leisa, PA-C.  Called Nurse Triage reporting Depression and Drug / Alcohol Assessment.  Symptoms began several years ago.  Interventions attempted: Nothing.  Symptoms are: unchanged.  Triage Disposition: Call PCP Within 24 Hours  Patient/caregiver understands and will follow disposition?: Yes    Copied from CRM #[id number]. Topic: Clinical - Red Word Triage >> Apr 29, 2024  8:37 AM Mia F wrote: Red Word that prompted transfer to Nurse Triage: Pt says he is out of work due to the loss of his vision. He says due to that he is becoming stressed and depressed. The stress is causing him to drink a lot more than he has. He admits that he need help. He says he is not used to being down like this so it is taking a huge toll on him. He says he does not eat enough. He says he is depending on alcohol to cope.   Pt had an appt today and canceled because he says he may have the flu but declined NT for that Reason for Disposition  Requesting to talk with a counselor (mental health worker, psychiatrist, etc.)  Answer Assessment - Initial Assessment Questions Seeking help with alcoholism. Adivsed to go to behavioral health UC or closest UC/ER, declines  1. ALCOHOL USE: Do you drink alcohol, including beer, wine or hard liquor?     beer 2. HOW OFTEN: How many days per week do you typically drink alcohol?     daily 3. HOW MUCH: How many drinks do you typically have on days when you drink? (1.5 oz hard liquor [one shot or jigger; 45 ml], 5 oz wine [small glass; 150 ml], 12 oz beer [one can; 360 ml])     6 beers today 4. MOST: What is the most that you have had to drink on any one occasion in the last month?      5. LAST 24 HOURS: Have you had a drink within the last 24 hours?      6. DRINKING PROBLEM:  Do you have or have you ever had an alcohol drinking problem?     States is alcoholic 7. DRUG PROBLEM: Are you using any other drugs? (e.g., yes/no; cocaine, prescription medicines, etc.)     Denies recreation drugs 8. SYMPTOMS: What symptoms are you currently experiencing? (e.g., none, tremors, or shakiness, abdomen pain, vomiting, blackout spells)     Abd pain when bends over 9. TREATMENT PROGRAM: Have you ever gone through an alcohol use treatment program?     no 10. THERAPIST: Do you have a counselor or therapist? If Yes, ask: What is their name?       no 11. SUPPORT: Who is with you now? Who do you live with? Do you have family or friends who you can talk to? Are you a member of Alcoholics Anonymous?       Lives with wife  Protocols used: Alcohol Use and Problems-A-AH

## 2024-04-29 NOTE — Telephone Encounter (Signed)
 Called w/no answer. Lvm

## 2024-05-17 ENCOUNTER — Ambulatory Visit (INDEPENDENT_AMBULATORY_CARE_PROVIDER_SITE_OTHER): Admitting: Family Medicine

## 2024-05-17 ENCOUNTER — Encounter: Payer: Self-pay | Admitting: Family Medicine

## 2024-05-17 VITALS — BP 130/70 | HR 92 | Resp 16 | Ht 67.0 in | Wt 183.0 lb

## 2024-05-17 DIAGNOSIS — J42 Unspecified chronic bronchitis: Secondary | ICD-10-CM

## 2024-05-17 DIAGNOSIS — Z720 Tobacco use: Secondary | ICD-10-CM

## 2024-05-17 DIAGNOSIS — I10 Essential (primary) hypertension: Secondary | ICD-10-CM

## 2024-05-17 DIAGNOSIS — Z7984 Long term (current) use of oral hypoglycemic drugs: Secondary | ICD-10-CM | POA: Diagnosis not present

## 2024-05-17 DIAGNOSIS — Z862 Personal history of diseases of the blood and blood-forming organs and certain disorders involving the immune mechanism: Secondary | ICD-10-CM | POA: Diagnosis not present

## 2024-05-17 DIAGNOSIS — E785 Hyperlipidemia, unspecified: Secondary | ICD-10-CM

## 2024-05-17 DIAGNOSIS — E1165 Type 2 diabetes mellitus with hyperglycemia: Secondary | ICD-10-CM | POA: Diagnosis not present

## 2024-05-17 DIAGNOSIS — Z122 Encounter for screening for malignant neoplasm of respiratory organs: Secondary | ICD-10-CM

## 2024-05-17 DIAGNOSIS — K219 Gastro-esophageal reflux disease without esophagitis: Secondary | ICD-10-CM | POA: Diagnosis not present

## 2024-05-17 DIAGNOSIS — Z125 Encounter for screening for malignant neoplasm of prostate: Secondary | ICD-10-CM

## 2024-05-17 MED ORDER — OMEPRAZOLE 40 MG PO CPDR: 40.0000 mg | DELAYED_RELEASE_CAPSULE | Freq: Every day | ORAL | 2 refills | Status: AC

## 2024-05-17 MED ORDER — ALBUTEROL SULFATE HFA 108 (90 BASE) MCG/ACT IN AERS: 1.0000 | INHALATION_SPRAY | Freq: Four times a day (QID) | RESPIRATORY_TRACT | 0 refills | Status: AC | PRN

## 2024-05-17 MED ORDER — HYDROCHLOROTHIAZIDE 25 MG PO TABS: 12.5000 mg | ORAL_TABLET | Freq: Every morning | ORAL | 0 refills | Status: AC

## 2024-05-17 MED ORDER — BLOOD GLUCOSE TEST VI STRP: 1.0000 | ORAL_STRIP | 0 refills | Status: AC

## 2024-05-17 MED ORDER — DOXAZOSIN MESYLATE 4 MG PO TABS: 4.0000 mg | ORAL_TABLET | Freq: Every day | ORAL | 0 refills | Status: AC

## 2024-05-17 MED ORDER — DILTIAZEM HCL ER COATED BEADS 240 MG PO CP24: ORAL_CAPSULE | ORAL | 0 refills | Status: AC

## 2024-05-17 MED ORDER — METFORMIN HCL ER 750 MG PO TB24: 750.0000 mg | ORAL_TABLET | Freq: Two times a day (BID) | ORAL | 0 refills | Status: AC

## 2024-05-17 MED ORDER — LANCET DEVICE MISC: 1.0000 | 0 refills | Status: AC

## 2024-05-17 MED ORDER — BLOOD GLUCOSE MONITORING SUPPL DEVI: 1.0000 | 0 refills | Status: AC

## 2024-05-17 MED ORDER — ROSUVASTATIN CALCIUM 10 MG PO TABS: 10.0000 mg | ORAL_TABLET | Freq: Every day | ORAL | 0 refills | Status: AC

## 2024-05-17 MED ORDER — BUDESONIDE-FORMOTEROL FUMARATE 160-4.5 MCG/ACT IN AERO: 2.0000 | INHALATION_SPRAY | Freq: Two times a day (BID) | RESPIRATORY_TRACT | 3 refills | Status: AC

## 2024-05-17 NOTE — Assessment & Plan Note (Addendum)
 HTN stable. Current BP at time of visit 130/70. No changes to medication regimen at today's visit.  -Continue Diltiazem  240mg  daily -Continue Doxazosin  4mg  daily -Continue hydrochlorothiazide  12.5mg  daily -Update labs -Return in 6 weeks for f/u  Orders:   CBC with Differential/Platelet   TSH   diltiazem  (CARDIZEM  CD) 240 MG 24 hr capsule; TAKE 1 CAPSULE BY MOUTH ONCE DAILY   doxazosin  (CARDURA ) 4 MG tablet; Take 1 tablet (4 mg total) by mouth daily.   hydrochlorothiazide  (HYDRODIURIL ) 25 MG tablet; Take 0.5 tablets (12.5 mg total) by mouth in the morning.   Magnesium

## 2024-05-17 NOTE — Assessment & Plan Note (Addendum)
 GERD symptoms controlled with Omeprazole  40mg  daily.   -Refill provided, Omeprazole  40mg  daily Orders:   omeprazole  (PRILOSEC) 40 MG capsule; Take 1 capsule (40 mg total) by mouth daily.

## 2024-05-17 NOTE — Progress Notes (Signed)
 "  Established Patient Office Visit  Subjective   Patient ID: Mark Hendrix, male    DOB: [birth year]  Age: 64 y.o. MRN: [id number]  Chief Complaint  Patient presents with   Medical Management of Chronic Issues    Stopped drinking alcohol beginning of year. Having some shaking in the mornings, but after eating it seems to stop    HPI Mark Hendrix is a pleasant 64 year old male who is seen today for follow up and medication refills.Mark Hendrix voices Mark Hendrix is currently on short term disability due to his vision. Mark Hendrix voices the left eye Mark Hendrix is almost blind. Mark Hendrix is suppose to wear corrective lenses but does not have these with him at time of visit. Mark Hendrix voices his previously employer had him go out on short term disability on 04/03/24. Mark Hendrix follows with [facility] and states Mark Hendrix was last seen not long before being out of work. Mark Hendrix admits Mark Hendrix has not contacted his eye doctor since being out of work. Mark Hendrix voices Mark Hendrix got a form and needs his doctor to feel this out. I advised him and his wife today that Mark Hendrix needs to make an appointment with [facility] for re-evaluation of symptoms and completion of forms. They are receptive.  Mark Hendrix quit drinking on 05/13/24. Mark Hendrix voices Mark Hendrix is experiencing some tremors but voices this typically improves after eating. Mark Hendrix admits Mark Hendrix had been depressed since being out of work, but states Mark Hendrix has been drinking all of life and this worsened when being out of work. Discussed option for referral to psychiatry and Mark Hendrix declines. Mark Hendrix voices Mark Hendrix will beat this on his own. Last drink was 05/12/24. Mark Hendrix voices the first 2 to 3 days were the worst, but Mark Hendrix is starting to feel better now. Denies seizures, LOC or dizziness.  PHQ9 notes poor appetite and Mark Hendrix admits Mark Hendrix had not been eating well s/t depression and ETOH use, resulting in weight loss seen. Mark Hendrix voices over the last day or two his appetite has improved and Mark Hendrix has been able to eat 3 meals yesterday.  Shortness of breath seen only with exertion, denies  shortness of breath at rest or with conversation.  Reviewed medications brought to visit. Mark Hendrix has not been taking Actos , Farxiga    Past Medical History:  Diagnosis Date   Arthritis    Bronchitis 03/05/2013   Diabetes mellitus without complication (HCC)    Hyperlipidemia    Hypertension      Review of Systems  Eyes:  Negative for pain.  Respiratory:  Positive for shortness of breath.   Cardiovascular:  Negative for chest pain and palpitations.  Neurological:  Positive for tremors. Negative for dizziness, seizures, loss of consciousness and headaches.      Objective:     BP 130/70   Pulse 92   Resp 16   Ht 5' 7 (1.702 m)   Wt 183 lb (83 kg)   SpO2 98%   BMI 28.66 kg/m  BP Readings from Last 3 Encounters:  05/17/24 130/70  09/30/23 136/82  11/20/22 (!) 172/84   Wt Readings from Last 3 Encounters:  05/17/24 183 lb (83 kg)  09/30/23 202 lb (91.6 kg)  02/08/22 186 lb 12.8 oz (84.7 kg)      Physical Exam Constitutional:      Appearance: Normal appearance.  Cardiovascular:     Rate and Rhythm: Normal rate and regular rhythm.     Pulses:          Dorsalis pedis pulses  are 2+ on the right side and 2+ on the left side.       Posterior tibial pulses are 2+ on the right side and 2+ on the left side.     Heart sounds: Normal heart sounds.  Pulmonary:     Effort: Pulmonary effort is normal. No respiratory distress.     Breath sounds: Normal breath sounds. No wheezing, rhonchi or rales.  Musculoskeletal:     Right lower leg: No edema.     Left lower leg: No edema.  Feet:     Right foot:     Protective Sensation: 8 sites tested.  8 sites sensed.     Skin integrity: Dry skin present.     Left foot:     Protective Sensation: 8 sites tested.  8 sites sensed.     Skin integrity: Dry skin present.  Skin:    General: Skin is warm and dry.  Neurological:     General: No focal deficit present.     Mental Status: Mark Hendrix is alert and oriented to person, place, and time.   Psychiatric:        Mood and Affect: Mood normal.        Behavior: Behavior normal.     Last CBC Lab Results  Component Value Date   WBC 8.5 09/30/2023   HGB 13.2 09/30/2023   HCT 39.2 09/30/2023   MCV 94.9 09/30/2023   MCH 32.0 09/30/2023   RDW 12.7 09/30/2023   PLT 280 09/30/2023   Last metabolic panel Lab Results  Component Value Date   GLUCOSE 123 (H) 09/30/2023   NA 141 09/30/2023   K 4.0 09/30/2023   CL 102 09/30/2023   CO2 27 09/30/2023   BUN 15 09/30/2023   CREATININE 0.97 09/30/2023   EGFR 88 09/30/2023   CALCIUM  9.1 09/30/2023   PHOS 3.9 06/08/2020   PROT 7.4 09/30/2023   ALBUMIN 4.4 11/19/2022   LABGLOB 2.8 06/08/2020   AGRATIO 1.6 06/08/2020   BILITOT 0.3 09/30/2023   ALKPHOS 63 11/19/2022   AST 18 09/30/2023   ALT 21 09/30/2023   ANIONGAP 15 11/19/2022   Last lipids Lab Results  Component Value Date   CHOL 180 09/30/2023   HDL 75 09/30/2023   LDLCALC 77 09/30/2023   TRIG 185 (H) 09/30/2023   CHOLHDL 2.4 09/30/2023   Last hemoglobin A1c Lab Results  Component Value Date   HGBA1C 7.8 (H) 09/30/2023   Last thyroid  functions Lab Results  Component Value Date   TSH 1.19 01/04/2022   T4TOTAL 7.8 06/08/2020           Assessment & Plan:   Assessment & Plan Uncontrolled type 2 diabetes mellitus with hyperglycemia (HCC) Last A1c 7.8 in 09/2023. Mark Hendrix reports Mark Hendrix had made dietary changes since stopping ETOH use and is now eating less sugar than Mark Hendrix use to. Mark Hendrix reports compliance with Metformin . Previously prescribed Farxiga , unsure if Mark Hendrix had been taking this but this prescription is not with other medications brought to the office today so Mark Hendrix is not currently taking Farxiga .  -Encouraged continue diabetic friendly diet -With tremors that improve after eating as noted in HPI, recommended checking blood sugar fasting once daily everyday and as needed for symptoms/episodes as prescribed -Refill Metformin , continue Metformin  ER 750mg  BID -Update  labs -Return in 6 weeks for f/u  Orders:   HgB A1c   Comprehensive Metabolic Panel (CMET)   Blood Glucose Monitoring Suppl DEVI; 1 each by Does not apply route as  directed. Dispense based on patient and insurance preference. Use up to four times daily as directed. (FOR ICD-10 E10.9, E11.9).   Glucose Blood (BLOOD GLUCOSE TEST STRIPS) STRP; 1 each by Does not apply route as directed. Dispense based on patient and insurance preference. Use up to four times daily as directed. (FOR ICD-10 E10.9, E11.9).   Lancet Device MISC; 1 each by Does not apply route as directed. Dispense based on patient and insurance preference. Use up to four times daily as directed. (FOR ICD-10 E10.9, E11.9).   metFORMIN  (GLUCOPHAGE -XR) 750 MG 24 hr tablet; Take 1 tablet (750 mg total) by mouth 2 (two) times daily with a meal.   Magnesium   HM Diabetes Foot Exam  Hyperlipidemia, unspecified hyperlipidemia type Hyperlipidemia previously controlled. Last LDL 77 in 09/2023.  -Update labs -Continue Rosvuastatin 10mg  once daily -6 week f/u Orders:   Comprehensive Metabolic Panel (CMET)   Lipid Profile   rosuvastatin  (CRESTOR ) 10 MG tablet; Take 1 tablet (10 mg total) by mouth at bedtime.   Magnesium  Essential hypertension HTN stable. Current BP at time of visit 130/70. No changes to medication regimen at today's visit.  -Continue Diltiazem  240mg  daily -Continue Doxazosin  4mg  daily -Continue hydrochlorothiazide  12.5mg  daily -Update labs -Return in 6 weeks for f/u  Orders:   CBC with Differential/Platelet   TSH   diltiazem  (CARDIZEM  CD) 240 MG 24 hr capsule; TAKE 1 CAPSULE BY MOUTH ONCE DAILY   doxazosin  (CARDURA ) 4 MG tablet; Take 1 tablet (4 mg total) by mouth daily.   hydrochlorothiazide  (HYDRODIURIL ) 25 MG tablet; Take 0.5 tablets (12.5 mg total) by mouth in the morning.   Magnesium  Prostate cancer screening Prostate cancer screening not UTD. PSA level ordered today. Orders:   PSA  Tobacco  use Current tobacco use and mild increase since stopping ETOH use. Encouraged tobacco cessation but at this time not currently ready to quit smoking. Mark Hendrix had voiced Mark Hendrix needs to focus on one thing at time, and currently focusing on abstaining from ETOH use.   -Recommended screening for lung cancer, which Mark Hendrix is agreeable -CT lung screening ordered Orders:   CT CHEST LUNG CA SCREEN LOW DOSE W/O CM; Future  Screening for lung cancer Current tobacco use with pack year of 40 years and current use of 1 PPD  -CT chest lung screening ordered -6 week f/u as noted Orders:   CT CHEST LUNG CA SCREEN LOW DOSE W/O CM; Future  History of anemia Past documentation notes hx of anemia. HGB previously 13.2 on 09/30/23 and prior to this mild decrease in HGB with HGB of 12.4 in 11/2022.   -Due to hx of chronic ETOH use will add on B12 & folate levels Orders:   CBC with Differential/Platelet   B12 and Folate Panel  Gastroesophageal reflux disease, unspecified whether esophagitis present GERD symptoms controlled with Omeprazole  40mg  daily.   -Refill provided, Omeprazole  40mg  daily Orders:   omeprazole  (PRILOSEC) 40 MG capsule; Take 1 capsule (40 mg total) by mouth daily.  Chronic bronchitis, unspecified chronic bronchitis type (HCC) COPD/ chronic bronchitis. Denies shortness of breath at rest or with conversation. Does endorse shortness of breath with increased exertion.  V/s stable. Lung fields clear to auscultation  -Refill Symbicort  BID  -Refill Albuterol  to be used as needed -6 week f/u  Orders:   budesonide -formoterol  (SYMBICORT ) 160-4.5 MCG/ACT inhaler; Inhale 2 puffs into the lungs 2 (two) times daily. Rinse out mouth after each use   albuterol  (VENTOLIN  HFA) 108 (90 Base) MCG/ACT  inhaler; Inhale 1-2 puffs into the lungs every 6 (six) hours as needed for wheezing or shortness of breath.      Return in about 6 weeks (around 06/28/2024).    LAYMON LOISE CORE, FNP "

## 2024-05-17 NOTE — Assessment & Plan Note (Addendum)
 Last A1c 7.8 in 09/2023. He reports he had made dietary changes since stopping ETOH use and is now eating less sugar than he use to. He reports compliance with Metformin . Previously prescribed Farxiga , unsure if he had been taking this but this prescription is not with other medications brought to the office today so he is not currently taking Farxiga .  -Encouraged continue diabetic friendly diet -With tremors that improve after eating as noted in HPI, recommended checking blood sugar fasting once daily everyday and as needed for symptoms/episodes as prescribed -Refill Metformin , continue Metformin  ER 750mg  BID -Update labs -Return in 6 weeks for f/u  Orders:   HgB A1c   Comprehensive Metabolic Panel (CMET)   Blood Glucose Monitoring Suppl DEVI; 1 each by Does not apply route as directed. Dispense based on patient and insurance preference. Use up to four times daily as directed. (FOR ICD-10 E10.9, E11.9).   Glucose Blood (BLOOD GLUCOSE TEST STRIPS) STRP; 1 each by Does not apply route as directed. Dispense based on patient and insurance preference. Use up to four times daily as directed. (FOR ICD-10 E10.9, E11.9).   Lancet Device MISC; 1 each by Does not apply route as directed. Dispense based on patient and insurance preference. Use up to four times daily as directed. (FOR ICD-10 E10.9, E11.9).   metFORMIN  (GLUCOPHAGE -XR) 750 MG 24 hr tablet; Take 1 tablet (750 mg total) by mouth 2 (two) times daily with a meal.   Magnesium   HM Diabetes Foot Exam

## 2024-05-17 NOTE — Assessment & Plan Note (Addendum)
 Hyperlipidemia previously controlled. Last LDL 77 in 09/2023.  -Update labs -Continue Rosvuastatin 10mg  once daily -6 week f/u Orders:   Comprehensive Metabolic Panel (CMET)   Lipid Profile   rosuvastatin  (CRESTOR ) 10 MG tablet; Take 1 tablet (10 mg total) by mouth at bedtime.   Magnesium

## 2024-05-18 ENCOUNTER — Ambulatory Visit: Payer: Self-pay | Admitting: Family Medicine

## 2024-05-18 LAB — COMPREHENSIVE METABOLIC PANEL WITH GFR
AG Ratio: 1.5 (calc) (ref 1.0–2.5)
ALT: 78 U/L — ABNORMAL HIGH (ref 9–46)
AST: 54 U/L — ABNORMAL HIGH (ref 10–35)
Albumin: 4.2 g/dL (ref 3.6–5.1)
Alkaline phosphatase (APISO): 74 U/L (ref 35–144)
BUN: 14 mg/dL (ref 7–25)
CO2: 28 mmol/L (ref 20–32)
Calcium: 9.5 mg/dL (ref 8.6–10.3)
Chloride: 99 mmol/L (ref 98–110)
Creat: 0.95 mg/dL (ref 0.70–1.35)
Globulin: 2.8 g/dL (ref 1.9–3.7)
Glucose, Bld: 310 mg/dL — ABNORMAL HIGH (ref 65–99)
Potassium: 3.9 mmol/L (ref 3.5–5.3)
Sodium: 139 mmol/L (ref 135–146)
Total Bilirubin: 0.6 mg/dL (ref 0.2–1.2)
Total Protein: 7 g/dL (ref 6.1–8.1)
eGFR: 90 mL/min/1.73m2

## 2024-05-18 LAB — ADVANCED WRITTEN NOTIFICATION (AWN) TEST REFUSAL: AWN TEST REFUSED: 7065

## 2024-05-18 LAB — CBC WITH DIFFERENTIAL/PLATELET
Absolute Lymphocytes: 1314 {cells}/uL (ref 850–3900)
Absolute Monocytes: 1044 {cells}/uL — ABNORMAL HIGH (ref 200–950)
Basophils Absolute: 72 {cells}/uL (ref 0–200)
Basophils Relative: 0.8 %
Eosinophils Absolute: 81 {cells}/uL (ref 15–500)
Eosinophils Relative: 0.9 %
HCT: 42.9 % (ref 39.4–51.1)
Hemoglobin: 14.1 g/dL (ref 13.2–17.1)
MCH: 31.3 pg (ref 27.0–33.0)
MCHC: 32.9 g/dL (ref 31.6–35.4)
MCV: 95.1 fL (ref 81.4–101.7)
MPV: 10.1 fL (ref 7.5–12.5)
Monocytes Relative: 11.6 %
Neutro Abs: 6489 {cells}/uL (ref 1500–7800)
Neutrophils Relative %: 72.1 %
Platelets: 229 Thousand/uL (ref 140–400)
RBC: 4.51 Million/uL (ref 4.20–5.80)
RDW: 12.4 % (ref 11.0–15.0)
Total Lymphocyte: 14.6 %
WBC: 9 Thousand/uL (ref 3.8–10.8)

## 2024-05-18 LAB — HEMOGLOBIN A1C
Hgb A1c MFr Bld: 7.1 % — ABNORMAL HIGH
Mean Plasma Glucose: 157 mg/dL
eAG (mmol/L): 8.7 mmol/L

## 2024-05-18 LAB — LIPID PANEL
Cholesterol: 168 mg/dL
HDL: 97 mg/dL
LDL Cholesterol (Calc): 53 mg/dL
Non-HDL Cholesterol (Calc): 71 mg/dL
Total CHOL/HDL Ratio: 1.7 (calc)
Triglycerides: 97 mg/dL

## 2024-05-18 LAB — PSA: PSA: 0.14 ng/mL

## 2024-05-18 LAB — TSH: TSH: 1.21 m[IU]/L (ref 0.40–4.50)

## 2024-05-18 LAB — MAGNESIUM: Magnesium: 1.4 mg/dL — ABNORMAL LOW (ref 1.5–2.5)

## 2024-05-24 ENCOUNTER — Ambulatory Visit
Admission: RE | Admit: 2024-05-24 | Discharge: 2024-05-24 | Disposition: A | Discharge status: Home / Self Care | Source: Ambulatory Visit | Attending: Family Medicine | Admitting: Family Medicine

## 2024-05-24 DIAGNOSIS — Z122 Encounter for screening for malignant neoplasm of respiratory organs: Secondary | ICD-10-CM | POA: Insufficient documentation

## 2024-05-24 DIAGNOSIS — Z720 Tobacco use: Secondary | ICD-10-CM | POA: Insufficient documentation

## 2024-06-28 ENCOUNTER — Ambulatory Visit: Admitting: Family Medicine
